# Patient Record
Sex: Male | Born: 1945 | Race: White | Hispanic: No | Marital: Married | Smoking: Former smoker
Health system: Southern US, Community
[De-identification: ages and names within clinical notes are randomized; demographics above are authoritative.]

## PROBLEM LIST (undated history)

## (undated) DIAGNOSIS — I10 Essential (primary) hypertension: Secondary | ICD-10-CM

## (undated) DIAGNOSIS — E119 Type 2 diabetes mellitus without complications: Secondary | ICD-10-CM

## (undated) DIAGNOSIS — F4310 Post-traumatic stress disorder, unspecified: Secondary | ICD-10-CM

## (undated) DIAGNOSIS — I639 Cerebral infarction, unspecified: Secondary | ICD-10-CM

## (undated) HISTORY — PX: TONSILLECTOMY: SUR1361

---

## 2015-02-17 ENCOUNTER — Encounter (HOSPITAL_COMMUNITY): Payer: Self-pay | Admitting: Family Medicine

## 2015-02-17 ENCOUNTER — Emergency Department (HOSPITAL_COMMUNITY): Payer: Medicare HMO

## 2015-02-17 ENCOUNTER — Emergency Department (HOSPITAL_COMMUNITY)
Admission: EM | Admit: 2015-02-17 | Discharge: 2015-02-18 | Disposition: A | Discharge status: Home / Self Care | Payer: Medicare HMO | Attending: Emergency Medicine | Admitting: Emergency Medicine

## 2015-02-17 DIAGNOSIS — E119 Type 2 diabetes mellitus without complications: Secondary | ICD-10-CM | POA: Insufficient documentation

## 2015-02-17 DIAGNOSIS — Z79899 Other long term (current) drug therapy: Secondary | ICD-10-CM | POA: Insufficient documentation

## 2015-02-17 DIAGNOSIS — M545 Low back pain, unspecified: Secondary | ICD-10-CM

## 2015-02-17 DIAGNOSIS — Z7982 Long term (current) use of aspirin: Secondary | ICD-10-CM | POA: Insufficient documentation

## 2015-02-17 DIAGNOSIS — Y9389 Activity, other specified: Secondary | ICD-10-CM | POA: Diagnosis not present

## 2015-02-17 DIAGNOSIS — S3992XA Unspecified injury of lower back, initial encounter: Secondary | ICD-10-CM | POA: Insufficient documentation

## 2015-02-17 DIAGNOSIS — S8012XA Contusion of left lower leg, initial encounter: Secondary | ICD-10-CM | POA: Diagnosis not present

## 2015-02-17 DIAGNOSIS — Y998 Other external cause status: Secondary | ICD-10-CM | POA: Insufficient documentation

## 2015-02-17 DIAGNOSIS — Z8673 Personal history of transient ischemic attack (TIA), and cerebral infarction without residual deficits: Secondary | ICD-10-CM | POA: Insufficient documentation

## 2015-02-17 DIAGNOSIS — Z794 Long term (current) use of insulin: Secondary | ICD-10-CM | POA: Insufficient documentation

## 2015-02-17 DIAGNOSIS — F4310 Post-traumatic stress disorder, unspecified: Secondary | ICD-10-CM | POA: Insufficient documentation

## 2015-02-17 DIAGNOSIS — S8992XA Unspecified injury of left lower leg, initial encounter: Secondary | ICD-10-CM | POA: Diagnosis present

## 2015-02-17 DIAGNOSIS — Y9289 Other specified places as the place of occurrence of the external cause: Secondary | ICD-10-CM | POA: Diagnosis not present

## 2015-02-17 DIAGNOSIS — W28XXXA Contact with powered lawn mower, initial encounter: Secondary | ICD-10-CM

## 2015-02-17 DIAGNOSIS — I10 Essential (primary) hypertension: Secondary | ICD-10-CM | POA: Insufficient documentation

## 2015-02-17 HISTORY — DX: Post-traumatic stress disorder, unspecified: F43.10

## 2015-02-17 HISTORY — DX: Essential (primary) hypertension: I10

## 2015-02-17 HISTORY — DX: Type 2 diabetes mellitus without complications: E11.9

## 2015-02-17 HISTORY — DX: Cerebral infarction, unspecified: I63.9

## 2015-02-17 LAB — CBG MONITORING, ED: GLUCOSE-CAPILLARY: 98 mg/dL (ref 65–99)

## 2015-02-17 LAB — BASIC METABOLIC PANEL
Anion gap: 9 (ref 5–15)
BUN: 10 mg/dL (ref 6–20)
CHLORIDE: 106 mmol/L (ref 101–111)
CO2: 23 mmol/L (ref 22–32)
Calcium: 9.3 mg/dL (ref 8.9–10.3)
Creatinine, Ser: 0.95 mg/dL (ref 0.61–1.24)
GFR calc non Af Amer: >60 mL/min (ref >60)
Glucose, Bld: 100 mg/dL — ABNORMAL HIGH (ref 65–99)
Potassium: 4.5 mmol/L (ref 3.5–5.1)
Sodium: 138 mmol/L (ref 135–145)

## 2015-02-17 LAB — CBC WITH DIFFERENTIAL/PLATELET
Basophils Absolute: 0.1 10*3/uL (ref 0.0–0.1)
Basophils Relative: 1 % (ref 0–1)
EOS ABS: 0.2 10*3/uL (ref 0.0–0.7)
Eosinophils Relative: 2 % (ref 0–5)
HCT: 41.9 % (ref 39.0–52.0)
Hemoglobin: 14.3 g/dL (ref 13.0–17.0)
Lymphocytes Relative: 19 % (ref 12–46)
Lymphs Abs: 2 10*3/uL (ref 0.7–4.0)
MCH: 29.2 pg (ref 26.0–34.0)
MCHC: 34.1 g/dL (ref 30.0–36.0)
MCV: 85.7 fL (ref 78.0–100.0)
MONOS PCT: 5 % (ref 3–12)
Monocytes Absolute: 0.5 10*3/uL (ref 0.1–1.0)
Neutro Abs: 7.4 10*3/uL (ref 1.7–7.7)
Neutrophils Relative %: 73 % (ref 43–77)
Platelets: 240 10*3/uL (ref 150–400)
RBC: 4.89 MIL/uL (ref 4.22–5.81)
RDW: 13.6 % (ref 11.5–15.5)
WBC: 10.1 10*3/uL (ref 4.0–10.5)

## 2015-02-17 LAB — PROTIME-INR
INR: 0.99 (ref 0.00–1.49)
Prothrombin Time: 13.2 seconds (ref 11.6–15.2)

## 2015-02-17 LAB — I-STAT CG4 LACTIC ACID, ED
LACTIC ACID, VENOUS: 1.6 mmol/L (ref 0.5–2.0)
Lactic Acid, Venous: 2.36 mmol/L (ref 0.5–2.0)

## 2015-02-17 LAB — LIPASE, BLOOD: LIPASE: 17 U/L — AB (ref 22–51)

## 2015-02-17 MED ORDER — SODIUM CHLORIDE 0.9 % IV BOLUS (SEPSIS)
1000.0000 mL | Freq: Once | INTRAVENOUS | Status: AC
  Administered 2015-02-17: 1000 mL via INTRAVENOUS

## 2015-02-17 MED ORDER — MORPHINE SULFATE 4 MG/ML IJ SOLN: 4.0000 mg | Freq: Once | INTRAMUSCULAR | Status: DC
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
## 2015-02-17 NOTE — Progress Notes (Signed)
Pt on room air at this time. Rt will continue to monitor.

## 2015-02-17 NOTE — ED Notes (Signed)
Dr Rhunette CroftNanavati given a copy of lactic acid results 2.36
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
## 2015-02-18 MED ORDER — HYDROCODONE-ACETAMINOPHEN 5-325 MG PO TABS: 2.0000 | ORAL_TABLET | Freq: Four times a day (QID) | ORAL | Status: DC | PRN

## 2015-02-18 NOTE — ED Notes (Signed)
Pt. Left with all belongings 

## 2015-02-18 NOTE — Discharge Instructions (Signed)
Back Pain, Adult Low back pain is very common. About 1 in 5 people have back pain.The cause of low back pain is rarely dangerous. The pain often gets better over time.About half of people with a sudden onset of back pain feel better in just 2 weeks. About 8 in 10 people feel better by 6 weeks.  CAUSES Some common causes of back pain include:  Strain of the muscles or ligaments supporting the spine.  Wear and tear (degeneration) of the spinal discs.  Arthritis.  Direct injury to the back. DIAGNOSIS Most of the time, the direct cause of low back pain is not known.However, back pain can be treated effectively even when the exact cause of the pain is unknown.Answering your caregiver's questions about your overall health and symptoms is one of the most accurate ways to make sure the cause of your pain is not dangerous. If your caregiver needs more information, he or she may order lab work or imaging tests (X-rays or MRIs).However, even if imaging tests show changes in your back, this usually does not require surgery. HOME CARE INSTRUCTIONS For many people, back pain returns.Since low back pain is rarely dangerous, it is often a condition that people can learn to manageon their own.   Remain active. It is stressful on the back to sit or stand in one place. Do not sit, drive, or stand in one place for more than 30 minutes at a time. Take short walks on level surfaces as soon as pain allows.Try to increase the length of time you walk each day.  Do not stay in bed.Resting more than 1 or 2 days can delay your recovery.  Do not avoid exercise or work.Your body is made to move.It is not dangerous to be active, even though your back may hurt.Your back will likely heal faster if you return to being active before your pain is gone.  Pay attention to your body when you bend and lift. Many people have less discomfortwhen lifting if they bend their knees, keep the load close to their bodies,and  avoid twisting. Often, the most comfortable positions are those that put less stress on your recovering back.  Find a comfortable position to sleep. Use a firm mattress and lie on your side with your knees slightly bent. If you lie on your back, put a pillow under your knees.  Only take over-the-counter or prescription medicines as directed by your caregiver. Over-the-counter medicines to reduce pain and inflammation are often the most helpful.Your caregiver may prescribe muscle relaxant drugs.These medicines help dull your pain so you can more quickly return to your normal activities and healthy exercise.  Put ice on the injured area.  Put ice in a plastic bag.  Place a towel between your skin and the bag.  Leave the ice on for 15-20 minutes, 03-04 times a day for the first 2 to 3 days. After that, ice and heat may be alternated to reduce pain and spasms.  Ask your caregiver about trying back exercises and gentle massage. This may be of some benefit.  Avoid feeling anxious or stressed.Stress increases muscle tension and can worsen back pain.It is important to recognize when you are anxious or stressed and learn ways to manage it.Exercise is a great option. SEEK MEDICAL CARE IF:  You have pain that is not relieved with rest or medicine.  You have pain that does not improve in 1 week.  You have new symptoms.  You are generally not feeling well. SEEK   IMMEDIATE MEDICAL CARE IF:   You have pain that radiates from your back into your legs.  You develop new bowel or bladder control problems.  You have unusual weakness or numbness in your arms or legs.  You develop nausea or vomiting.  You develop abdominal pain.  You feel faint. Document Released: 09/22/2005 Document Revised: 03/23/2012 Document Reviewed: 01/24/2014 [employer] Patient Information 2015 [employer]. This information is not intended to replace advice given to you by your health care provider. Make sure you  discuss any questions you have with your health care provider.  

## 2018-10-30 ENCOUNTER — Emergency Department (HOSPITAL_COMMUNITY): Payer: No Typology Code available for payment source

## 2018-10-30 ENCOUNTER — Inpatient Hospital Stay (HOSPITAL_COMMUNITY)
Admission: EM | Admit: 2018-10-30 | Discharge: 2018-11-01 | DRG: 202 | Disposition: A | Discharge status: Home / Self Care | Payer: No Typology Code available for payment source | Attending: Internal Medicine | Admitting: Internal Medicine

## 2018-10-30 ENCOUNTER — Other Ambulatory Visit: Payer: Self-pay

## 2018-10-30 ENCOUNTER — Encounter (HOSPITAL_COMMUNITY): Payer: Self-pay | Admitting: *Deleted

## 2018-10-30 DIAGNOSIS — Z888 Allergy status to other drugs, medicaments and biological substances status: Secondary | ICD-10-CM | POA: Diagnosis not present

## 2018-10-30 DIAGNOSIS — J9601 Acute respiratory failure with hypoxia: Secondary | ICD-10-CM | POA: Diagnosis present

## 2018-10-30 DIAGNOSIS — Z8673 Personal history of transient ischemic attack (TIA), and cerebral infarction without residual deficits: Secondary | ICD-10-CM | POA: Diagnosis not present

## 2018-10-30 DIAGNOSIS — J209 Acute bronchitis, unspecified: Principal | ICD-10-CM | POA: Diagnosis present

## 2018-10-30 DIAGNOSIS — R0602 Shortness of breath: Secondary | ICD-10-CM

## 2018-10-30 DIAGNOSIS — I10 Essential (primary) hypertension: Secondary | ICD-10-CM | POA: Diagnosis present

## 2018-10-30 DIAGNOSIS — Z7982 Long term (current) use of aspirin: Secondary | ICD-10-CM | POA: Diagnosis not present

## 2018-10-30 DIAGNOSIS — F4310 Post-traumatic stress disorder, unspecified: Secondary | ICD-10-CM | POA: Diagnosis present

## 2018-10-30 DIAGNOSIS — E119 Type 2 diabetes mellitus without complications: Secondary | ICD-10-CM | POA: Diagnosis present

## 2018-10-30 DIAGNOSIS — Z79899 Other long term (current) drug therapy: Secondary | ICD-10-CM

## 2018-10-30 DIAGNOSIS — G4733 Obstructive sleep apnea (adult) (pediatric): Secondary | ICD-10-CM | POA: Diagnosis present

## 2018-10-30 DIAGNOSIS — Z794 Long term (current) use of insulin: Secondary | ICD-10-CM

## 2018-10-30 DIAGNOSIS — J9600 Acute respiratory failure, unspecified whether with hypoxia or hypercapnia: Secondary | ICD-10-CM | POA: Diagnosis present

## 2018-10-30 LAB — BASIC METABOLIC PANEL
Anion gap: 15 (ref 5–15)
BUN: 12 mg/dL (ref 8–23)
CO2: 22 mmol/L (ref 22–32)
Calcium: 8.9 mg/dL (ref 8.9–10.3)
Chloride: 103 mmol/L (ref 98–111)
Creatinine, Ser: 1.08 mg/dL (ref 0.61–1.24)
GFR calc Af Amer: >60 mL/min (ref >60)
GFR calc non Af Amer: >60 mL/min (ref >60)
Glucose, Bld: 261 mg/dL — ABNORMAL HIGH (ref 70–99)
Potassium: 3.9 mmol/L (ref 3.5–5.1)
SODIUM: 140 mmol/L (ref 135–145)

## 2018-10-30 LAB — POCT I-STAT 7, (LYTES, BLD GAS, ICA,H+H)
Acid-base deficit: 5 mmol/L — ABNORMAL HIGH (ref 0.0–2.0)
Bicarbonate: 18.3 mmol/L — ABNORMAL LOW (ref 20.0–28.0)
Calcium, Ion: 1.16 mmol/L (ref 1.15–1.40)
HCT: 35 % — ABNORMAL LOW (ref 39.0–52.0)
Hemoglobin: 11.9 g/dL — ABNORMAL LOW (ref 13.0–17.0)
O2 SAT: 96 %
Potassium: 2.9 mmol/L — ABNORMAL LOW (ref 3.5–5.1)
Sodium: 135 mmol/L (ref 135–145)
TCO2: 19 mmol/L — ABNORMAL LOW (ref 22–32)
pCO2 arterial: 27.3 mmHg — ABNORMAL LOW (ref 32.0–48.0)
pH, Arterial: 7.434 (ref 7.350–7.450)
pO2, Arterial: 80 mmHg — ABNORMAL LOW (ref 83.0–108.0)

## 2018-10-30 LAB — CBC WITH DIFFERENTIAL/PLATELET
Abs Immature Granulocytes: 0.26 10*3/uL — ABNORMAL HIGH (ref 0.00–0.07)
BASOS ABS: 0.1 10*3/uL (ref 0.0–0.1)
Basophils Relative: 1 %
EOS PCT: 1 %
Eosinophils Absolute: 0.2 10*3/uL (ref 0.0–0.5)
HCT: 43 % (ref 39.0–52.0)
Hemoglobin: 14.2 g/dL (ref 13.0–17.0)
Immature Granulocytes: 2 %
Lymphocytes Relative: 10 %
Lymphs Abs: 1.5 10*3/uL (ref 0.7–4.0)
MCH: 28.7 pg (ref 26.0–34.0)
MCHC: 33 g/dL (ref 30.0–36.0)
MCV: 86.9 fL (ref 80.0–100.0)
Monocytes Absolute: 0.4 10*3/uL (ref 0.1–1.0)
Monocytes Relative: 3 %
NRBC: 0 % (ref 0.0–0.2)
Neutro Abs: 13.3 10*3/uL — ABNORMAL HIGH (ref 1.7–7.7)
Neutrophils Relative %: 83 %
Platelets: 265 10*3/uL (ref 150–400)
RBC: 4.95 MIL/uL (ref 4.22–5.81)
RDW: 13.5 % (ref 11.5–15.5)
WBC: 15.8 10*3/uL — ABNORMAL HIGH (ref 4.0–10.5)

## 2018-10-30 LAB — BRAIN NATRIURETIC PEPTIDE: B NATRIURETIC PEPTIDE 5: 47.5 pg/mL (ref 0.0–100.0)

## 2018-10-30 LAB — CBG MONITORING, ED: GLUCOSE-CAPILLARY: 344 mg/dL — AB (ref 70–99)

## 2018-10-30 MED ORDER — IPRATROPIUM BROMIDE 0.02 % IN SOLN
0.5000 mg | Freq: Four times a day (QID) | RESPIRATORY_TRACT | Status: DC
  Administered 2018-10-31 (×2): 0.5 mg via RESPIRATORY_TRACT
  Filled 2018-10-30 (×2): qty 2.5

## 2018-10-30 MED ORDER — SODIUM CHLORIDE 0.9 % IV SOLN: 250.0000 mL | INTRAVENOUS | Status: DC | PRN

## 2018-10-30 MED ORDER — ONDANSETRON HCL 4 MG/2ML IJ SOLN: 4.0000 mg | Freq: Four times a day (QID) | INTRAMUSCULAR | Status: DC | PRN

## 2018-10-30 MED ORDER — VITAMIN D 25 MCG (1000 UNIT) PO TABS
1000.0000 [IU] | ORAL_TABLET | Freq: Every day | ORAL | Status: DC
  Administered 2018-10-31 – 2018-11-01 (×2): 1000 [IU] via ORAL
  Filled 2018-10-30 (×2): qty 1

## 2018-10-30 MED ORDER — GUAIFENESIN ER 600 MG PO TB12
600.0000 mg | ORAL_TABLET | Freq: Two times a day (BID) | ORAL | Status: DC
  Administered 2018-10-31 – 2018-11-01 (×3): 600 mg via ORAL
  Filled 2018-10-30 (×3): qty 1

## 2018-10-30 MED ORDER — ALBUTEROL SULFATE (2.5 MG/3ML) 0.083% IN NEBU
2.5000 mg | INHALATION_SOLUTION | RESPIRATORY_TRACT | Status: DC | PRN
  Administered 2018-11-01: 2.5 mg via RESPIRATORY_TRACT
  Filled 2018-10-30: qty 3

## 2018-10-30 MED ORDER — FLUOXETINE HCL 20 MG PO CAPS: 60.0000 mg | ORAL_CAPSULE | Freq: Every day | ORAL | Status: DC

## 2018-10-30 MED ORDER — AZITHROMYCIN 500 MG PO TABS
500.00 | ORAL_TABLET | ORAL | Status: DC
Start: 2018-10-31 — End: 2018-10-30

## 2018-10-30 MED ORDER — PREGABALIN 100 MG PO CAPS
300.0000 mg | ORAL_CAPSULE | Freq: Two times a day (BID) | ORAL | Status: DC
  Administered 2018-10-31 – 2018-11-01 (×2): 300 mg via ORAL
  Filled 2018-10-30 (×2): qty 3

## 2018-10-30 MED ORDER — ALBUTEROL SULFATE (2.5 MG/3ML) 0.083% IN NEBU
2.5000 mg | INHALATION_SOLUTION | Freq: Four times a day (QID) | RESPIRATORY_TRACT | Status: DC
  Administered 2018-10-31 (×2): 2.5 mg via RESPIRATORY_TRACT
  Filled 2018-10-30 (×2): qty 3

## 2018-10-30 MED ORDER — SODIUM CHLORIDE 0.9% FLUSH
3.0000 mL | Freq: Two times a day (BID) | INTRAVENOUS | Status: DC
  Administered 2018-10-31 – 2018-11-01 (×2): 3 mL via INTRAVENOUS

## 2018-10-30 MED ORDER — LORAZEPAM 2 MG/ML IJ SOLN: 0.5000 mg | Freq: Once | INTRAMUSCULAR | Status: DC

## 2018-10-30 MED ORDER — PRAZOSIN HCL 2 MG PO CAPS
15.0000 mg | ORAL_CAPSULE | Freq: Every day | ORAL | Status: DC
  Administered 2018-10-31: 15 mg via ORAL
  Filled 2018-10-30: qty 1

## 2018-10-30 MED ORDER — MAGNESIUM SULFATE 2 GM/50ML IV SOLN
2.0000 g | Freq: Once | INTRAVENOUS | Status: AC
  Administered 2018-10-30: 2 g via INTRAVENOUS
  Filled 2018-10-30: qty 50

## 2018-10-30 MED ORDER — MODAFINIL 100 MG PO TABS
300.0000 mg | ORAL_TABLET | Freq: Every day | ORAL | Status: DC
  Administered 2018-11-01: 300 mg via ORAL
  Filled 2018-10-30 (×2): qty 3

## 2018-10-30 MED ORDER — ATENOLOL 50 MG PO TABS
50.0000 mg | ORAL_TABLET | Freq: Two times a day (BID) | ORAL | Status: DC
  Administered 2018-10-30: 50 mg via ORAL
  Filled 2018-10-30: qty 1

## 2018-10-30 MED ORDER — SODIUM CHLORIDE 0.9% FLUSH: 3.0000 mL | INTRAVENOUS | Status: DC | PRN

## 2018-10-30 MED ORDER — PRAZOSIN HCL 2 MG PO CAPS
5.0000 mg | ORAL_CAPSULE | Freq: Once | ORAL | Status: DC
  Filled 2018-10-30 (×2): qty 1

## 2018-10-30 MED ORDER — ASPIRIN EC 325 MG PO TBEC
325.0000 mg | DELAYED_RELEASE_TABLET | Freq: Every day | ORAL | Status: DC
  Administered 2018-10-31 – 2018-11-01 (×2): 325 mg via ORAL
  Filled 2018-10-30 (×2): qty 1

## 2018-10-30 MED ORDER — METHYLPREDNISOLONE SODIUM SUCC 125 MG IJ SOLR
80.0000 mg | Freq: Two times a day (BID) | INTRAMUSCULAR | Status: DC
  Administered 2018-10-30: 80 mg via INTRAVENOUS
  Filled 2018-10-30: qty 2

## 2018-10-30 MED ORDER — INSULIN ASPART 100 UNIT/ML ~~LOC~~ SOLN: 0.0000 [IU] | Freq: Three times a day (TID) | SUBCUTANEOUS | Status: DC

## 2018-10-30 MED ORDER — ONDANSETRON HCL 4 MG PO TABS: 4.0000 mg | ORAL_TABLET | Freq: Four times a day (QID) | ORAL | Status: DC | PRN

## 2018-10-30 MED ORDER — LORAZEPAM 2 MG/ML IJ SOLN
0.5000 mg | Freq: Once | INTRAMUSCULAR | Status: AC
  Administered 2018-10-30: 0.5 mg via INTRAVENOUS

## 2018-10-30 MED ORDER — ALBUTEROL (5 MG/ML) CONTINUOUS INHALATION SOLN
10.0000 mg/h | INHALATION_SOLUTION | Freq: Once | RESPIRATORY_TRACT | Status: AC
  Administered 2018-10-30: 10 mg/h via RESPIRATORY_TRACT

## 2018-10-30 MED ORDER — BUPROPION HCL ER (XL) 150 MG PO TB24
300.0000 mg | ORAL_TABLET | Freq: Every day | ORAL | Status: DC
  Administered 2018-10-31 – 2018-11-01 (×2): 300 mg via ORAL
  Filled 2018-10-30 (×2): qty 2

## 2018-10-30 MED ORDER — LORAZEPAM 2 MG/ML IJ SOLN
INTRAMUSCULAR | Status: AC
  Filled 2018-10-30: qty 1
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
## 2018-10-30 NOTE — H&P (Signed)
History and Physical    Vincent Richmond VLD:[id number] DOB: September 01, 1946 DOA: 10/30/2018  PCP: [facility]  Patient coming from: Home  Chief Complaint: Shortness of breath and wheezing  HPI: Vincent Richmond is a 73 y.o. male with medical history significant of PTSD, hypertension, diabetes comes in with a viral illness that is been going on for 2weeks diagnosed the end and was treated with azithromycin.  Went to the emergency room in [place] again today I gave him Solu-Medrol and some breathing treatments and sent him home on prednisone.  He was unable to make at home he started wheezing again get very short of breath while he was getting his prescriptions filled.  His wife called 911 who found him diffusely wheezing with oxygen sats of 84% on room air at the pharmacy store.  Patient denies any fevers.  He is been coughing.  He is not a smoker.  He has no history of asthma.  He denies any chest pain lower extremity edema or swelling.  His wife is also sick with a URI.  Patient is being referred for admission for acute hypoxic respiratory failure currently on BiPAP and is feeling much better.  He is received several bronchodilators in the ED and is starting to be able to breathe a little bit more freely.  Review of Systems: As per HPI otherwise 10 point review of systems negative.   Past Medical History:  Diagnosis Date  . Diabetes mellitus without complication (HCC)   . Hypertension   . PTSD (post-traumatic stress disorder)   . Stroke [facility])     History reviewed. No pertinent surgical history.   reports that he has never smoked. He does not have any smokeless tobacco history on file. He reports that he does not drink alcohol. No history on file for drug.  Allergies  Allergen Reactions  . Statins Other (See Comments)    Severe muscle pains    No family history on file.  Premature coronary artery disease  Prior to Admission medications   Medication Sig Start Date End Date  Taking? Authorizing Provider  Ascorbic Acid (VITAMIN C PO) Take 100 mg by mouth daily.   Yes [provider]  aspirin EC 325 MG tablet Take 325 mg by mouth daily.   Yes [provider]  atenolol (TENORMIN) 50 MG tablet Take 50 mg by mouth 2 (two) times daily.   Yes [provider]  buPROPion (WELLBUTRIN XL) 300 MG 24 hr tablet Take 300 mg by mouth daily.   Yes [provider]  cholecalciferol (VITAMIN D) 1000 UNITS tablet Take 1,000 Units by mouth daily.   Yes [provider]  FLUoxetine (PROZAC) 20 MG capsule Take 60 mg by mouth daily. For depression and anxiety    Yes [provider]  glipiZIDE (GLUCOTROL) 5 MG tablet Take 10 mg by mouth 2 (two) times daily.    Yes [provider]  insulin glargine (LANTUS) 100 unit/mL SOPN Inject 75 Units into the skin daily.    Yes [provider]  loperamide (IMODIUM) 2 MG capsule Take 2 mg by mouth daily.   Yes [provider]  metFORMIN (GLUCOPHAGE) 1000 MG tablet Take 1,000 mg by mouth 2 (two) times daily with a meal.   Yes [provider]  modafinil (PROVIGIL) 100 MG tablet Take 300 mg by mouth daily.   Yes [provider]  NON FORMULARY CPAP Machine daily   Yes [provider]  Omega-3 Fatty Acids (FISH OIL) 1000  MG CAPS Take 1,000 mg by mouth daily.    Yes [provider]  omeprazole (PRILOSEC) 20 MG capsule Take 20 mg by mouth daily.   Yes [provider]  prazosin (MINIPRESS) 5 MG capsule Take 15 mg by mouth at bedtime. For nightmares and flashbacks    Yes [provider]  pregabalin (LYRICA) 300 MG capsule Take 300 mg by mouth 2 (two) times daily.   Yes [provider]  HYDROcodone-acetaminophen (NORCO/VICODIN) 5-325 MG per tablet Take 2 tablets by mouth every 6 (six) hours as needed. Patient not taking: Reported on 10/30/2018 02/18/15   Tegeler, Canary Brim, MD    Physical Exam: Vitals:   10/30/18 1918  10/30/18 1930 10/30/18 2000 10/30/18 2006  BP:  (!) 165/87 (!) 140/104   Pulse:  (!) 115 (!) 116 (!) 108  Resp:  20  (!) 22  Temp: 98.8 F (37.1 C)     TempSrc: Oral     SpO2:  97% 90% 97%  Weight:      Height:          Constitutional: NAD, calm, comfortable on BiPAP mentating normally Vitals:   10/30/18 1918 10/30/18 1930 10/30/18 2000 10/30/18 2006  BP:  (!) 165/87 (!) 140/104   Pulse:  (!) 115 (!) 116 (!) 108  Resp:  20  (!) 22  Temp: 98.8 F (37.1 C)     TempSrc: Oral     SpO2:  97% 90% 97%  Weight:      Height:       Eyes: PERRL, lids and conjunctivae normal ENMT: Mucous membranes are moist. Posterior pharynx clear of any exudate or lesions.Normal dentition.  Neck: normal, supple, no masses, no thyromegaly Respiratory: clear to auscultation bilaterally, expiratory wheezing with decreased air movement, no crackles. Normal respiratory effort. No accessory muscle use.  Cardiovascular: Regular rate and rhythm, no murmurs / rubs / gallops. No extremity edema. 2+ pedal pulses. No carotid bruits.  Abdomen: no tenderness, no masses palpated. No hepatosplenomegaly. Bowel sounds positive.  Musculoskeletal: no clubbing / cyanosis. No joint deformity upper and lower extremities. Good ROM, no contractures. Normal muscle tone.  Skin: no rashes, lesions, ulcers. No induration Neurologic: CN 2-12 grossly intact. Sensation intact, DTR normal. Strength 5/5 in all 4.  Psychiatric: Normal judgment and insight. Alert and oriented x 3. Normal mood.    Labs on Admission: I have personally reviewed following labs and imaging studies  CBC: Recent Labs  Lab 10/30/18 1844 10/30/18 1956  WBC 15.8*  --   NEUTROABS 13.3*  --   HGB 14.2 11.9*  HCT 43.0 35.0*  MCV 86.9  --   PLT 265  --    Basic Metabolic Panel: Recent Labs  Lab 10/30/18 1844 10/30/18 1956  NA 140 135  K 3.9 2.9*  CL 103  --   CO2 22  --   GLUCOSE 261*  --   BUN 12  --   CREATININE 1.08  --   CALCIUM 8.9  --     GFR: Estimated Creatinine Clearance: 81.2 mL/min (by C-G formula based on SCr of 1.08 mg/dL). Liver Function Tests: No results for input(s): AST, ALT, ALKPHOS, BILITOT, PROT, ALBUMIN in the last 168 hours. No results for input(s): LIPASE, AMYLASE in the last 168 hours. No results for input(s): AMMONIA in the last 168 hours. Coagulation Profile: No results for input(s): INR, PROTIME in the last 168 hours. Cardiac Enzymes: No results for input(s): CKTOTAL, CKMB, CKMBINDEX, TROPONINI in the last 168  hours. BNP (last 3 results) No results for input(s): PROBNP in the last 8760 hours. HbA1C: No results for input(s): HGBA1C in the last 72 hours. CBG: No results for input(s): GLUCAP in the last 168 hours. Lipid Profile: No results for input(s): CHOL, HDL, LDLCALC, TRIG, CHOLHDL, LDLDIRECT in the last 72 hours. Thyroid Function Tests: No results for input(s): TSH, T4TOTAL, FREET4, T3FREE, THYROIDAB in the last 72 hours. Anemia Panel: No results for input(s): VITAMINB12, FOLATE, FERRITIN, TIBC, IRON, RETICCTPCT in the last 72 hours. Urine analysis: No results found for: COLORURINE, APPEARANCEUR, LABSPEC, PHURINE, GLUCOSEU, HGBUR, BILIRUBINUR, KETONESUR, PROTEINUR, UROBILINOGEN, NITRITE, LEUKOCYTESUR Sepsis Labs: !!!!!!!!!!!!!!!!!!!!!!!!!!!!!!!!!!!!!!!!!!!! @LABRCNTIP (procalcitonin:4,lacticidven:4) )No results found for this or any previous visit (from the past 240 hour(s)).   Radiological Exams on Admission: Dg Chest Portable 1 View  Result Date: 10/30/2018 CLINICAL DATA:  Cough and increasing shortness of Breath EXAM: PORTABLE CHEST 1 VIEW COMPARISON:  02/17/2015 FINDINGS: Cardiac shadow is at the upper limits of normal in size. The lungs are well aerated bilaterally. No focal infiltrate or sizable effusion is seen. Degenerative changes of the thoracic spine are again seen. No other focal abnormality is noted. IMPRESSION: No acute abnormality seen. Electronically Signed   By: Alcide CleverMark  Lukens  M.D.   On: 10/30/2018 19:31   Chest x-ray reviewed no edema or infiltrate Old chart reviewed Case cussed with EDP  Assessment/Plan 73 year old male with acute respiratory hypoxic failure secondary to bronchitis with bronchospasm Principal Problem:   Acute respiratory failure with hypoxia (HCC)-continue BiPAP support and wean as tolerates.  Treat with Solu-Medrol frequent nebulizers and Mucinex as above below.  Active Problems:   Bronchitis with bronchospasm-Solu-Medrol 80 mg IV every 12 hours.  Frequent nebulizer treatments and Mucinex.  Supportive care.  Wean BiPAP as tolerates.  Chest x-ray negative for pneumonia.    PTSD (post-traumatic stress disorder)-noted    Diabetes mellitus without complication (HCC)-hold insulin and oral agents and placed on sliding scale insulin while he is here he is currently n.p.o. on BiPAP.    Hypertension-resume home meds     DVT prophylaxis: SCDs Code Status: Full Family Communication: Wife daughter and son Disposition Plan: 24 to 48 hours Consults called: None Admission status: Admission   , A MD [facility]  If 7PM-7AM, please contact night-coverage [email or web address] Password [email or web address]  10/30/2018, 8:41 PM

## 2018-10-30 NOTE — ED Provider Notes (Signed)
[facility] EMERGENCY DEPARTMENT Provider Note   CSN: [id number] Arrival date & time: 10/30/18  1825     History   Chief Complaint Chief Complaint  Patient presents with  . Shortness of Breath    HPI Vincent Richmond is a 73 y.o. male with PMH/o DM, HTN, PTSD by EMS for evaluation of shortness of breath.  Per wife, patient has had URI symptoms for the last 2-1/2 weeks.  She states that he has had congestion, cough has been persistent.  She reports that he was seen at urgent care and was prescribed some medication help with cough and was diagnosed with bronchitis.  He states that he continued to have worsening cough.  She reports that over the last 3 days, his cough, difficulty breathing has become significantly worse.  She reports that he was unable to sleep laying flat because of the cough.  She reports he had to sleep sitting up which is abnormal for him.  Patient went to [facility] ED today because of symptoms.  At that time, chest x-ray was unremarkable.  He did have some wheezing that was noted.  He was given steroids, breathing treatments x2 and had some improvement and was discharged home with antibiotics, prednisone.  Wife reports that when they got in the car, he continuously cough all the way home.  She reports she went out to fill the prescriptions and when she came back home, patient was having difficulty breathing so she called EMS.  On EMS arrival, patient with obvious wheezing noted throughout all lung fields.  He was 84% on room air.  EMS gave albuterol, Solu-Medrol which bumped up his O2 sats to 95% on room air.  Patient reports he feels slightly better after breathing treatment but still having significant work of breathing.  Patient reports no history of COPD but was told today at the ED earlier that he might be starting to have COPD.  Denies any smoking history.  Patient states he does not have any chest pain.  Patient denies any abdominal pain,  nausea/vomiting.  The history is provided by the patient and the spouse.    Past Medical History:  Diagnosis Date  . Diabetes mellitus without complication (HCC)   . Hypertension   . PTSD (post-traumatic stress disorder)   . Stroke [facility])     Patient Active Problem List   Diagnosis Date Noted  . Acute respiratory failure with hypoxia (HCC) 10/30/2018  . Bronchitis with bronchospasm 10/30/2018  . Acute respiratory failure (HCC) 10/30/2018  . PTSD (post-traumatic stress disorder)   . Diabetes mellitus without complication (HCC)   . Hypertension     History reviewed. No pertinent surgical history.      Home Medications    Prior to Admission medications   Medication Sig Start Date End Date Taking? Authorizing Provider  Ascorbic Acid (VITAMIN C PO) Take 100 mg by mouth daily.   Yes [provider]  aspirin EC 325 MG tablet Take 325 mg by mouth daily.   Yes [provider]  atenolol (TENORMIN) 50 MG tablet Take 50 mg by mouth 2 (two) times daily.   Yes [provider]  buPROPion (WELLBUTRIN XL) 300 MG 24 hr tablet Take 300 mg by mouth daily.   Yes [provider]  cholecalciferol (VITAMIN D) 1000 UNITS tablet Take 1,000 Units by mouth daily.   Yes [provider]  FLUoxetine (PROZAC) 20 MG capsule Take 60 mg by mouth daily. For depression and anxiety  Yes [provider]  glipiZIDE (GLUCOTROL) 5 MG tablet Take 10 mg by mouth 2 (two) times daily.    Yes [provider]  insulin glargine (LANTUS) 100 unit/mL SOPN Inject 75 Units into the skin daily.    Yes [provider]  loperamide (IMODIUM) 2 MG capsule Take 2 mg by mouth daily.   Yes [provider]  metFORMIN (GLUCOPHAGE) 1000 MG tablet Take 1,000 mg by mouth 2 (two) times daily with a meal.   Yes [provider]  modafinil (PROVIGIL) 100 MG tablet Take 300 mg by mouth daily.   Yes [provider]  NON FORMULARY CPAP Machine  daily   Yes [provider]  Omega-3 Fatty Acids (FISH OIL) 1000 MG CAPS Take 1,000 mg by mouth daily.    Yes [provider]  omeprazole (PRILOSEC) 20 MG capsule Take 20 mg by mouth daily.   Yes [provider]  prazosin (MINIPRESS) 5 MG capsule Take 15 mg by mouth at bedtime. For nightmares and flashbacks    Yes [provider]  pregabalin (LYRICA) 300 MG capsule Take 300 mg by mouth 2 (two) times daily.   Yes [provider]  HYDROcodone-acetaminophen (NORCO/VICODIN) 5-325 MG per tablet Take 2 tablets by mouth every 6 (six) hours as needed. Patient not taking: Reported on 10/30/2018 02/18/15   Tegeler, Canary Brim, MD    Family History No family history on file.  Social History Social History   Tobacco Use  . Smoking status: Never Smoker  Substance Use Topics  . Alcohol use: No  . Drug use: Not on file     Allergies   Statins   Review of Systems Review of Systems  Constitutional: Negative for fever.  HENT: Positive for congestion.   Respiratory: Positive for cough, shortness of breath and wheezing.   Cardiovascular: Negative for chest pain.  Gastrointestinal: Negative for abdominal pain, nausea and vomiting.  Genitourinary: Negative for dysuria and hematuria.  Neurological: Negative for headaches.  All other systems reviewed and are negative.    Physical Exam Updated Vital Signs BP (!) 140/104   Pulse (!) 108   Temp 98.8 F (37.1 C) (Oral)   Resp (!) 22   Ht 5\' 11"  (1.803 m)   Wt 122.5 kg   SpO2 97%   BMI 37.66 kg/m   Physical Exam Vitals signs and nursing note reviewed.  Constitutional:      Appearance: Normal appearance. He is well-developed. He is ill-appearing.  HENT:     Head: Normocephalic and atraumatic.  Eyes:     General: Lids are normal.     Conjunctiva/sclera: Conjunctivae normal.     Pupils: Pupils are equal, round, and reactive to light.  Neck:     Musculoskeletal: Full passive range of motion  without pain.  Cardiovascular:     Rate and Rhythm: Regular rhythm. Tachycardia present.     Pulses: Normal pulses.     Heart sounds: Normal heart sounds. No murmur. No friction rub. No gallop.   Pulmonary:     Effort: Tachypnea present.     Breath sounds: Decreased breath sounds and wheezing present.     Comments: Audible wheezing present. Wheezing noted throughout all lung fields. Speaking in very short sentences.  Abdominal:     Palpations: Abdomen is soft. Abdomen is not rigid.     Tenderness: There is no abdominal tenderness. There is no guarding.  Musculoskeletal: Normal range of motion.     Comments: 1+ pitting edema  noted bilateral lower extremities at the distal tib-fib.  No overlying warmth, erythema, edema.  Skin:    General: Skin is warm and dry.     Capillary Refill: Capillary refill takes less than 2 seconds.  Neurological:     Mental Status: He is alert and oriented to person, place, and time.  Psychiatric:        Speech: Speech normal.     ED Treatments / Results  Labs (all labs ordered are listed, but only abnormal results are displayed) Labs Reviewed  CBC WITH DIFFERENTIAL/PLATELET - Abnormal; Notable for the following components:      Result Value   WBC 15.8 (*)    Neutro Abs 13.3 (*)    Abs Immature Granulocytes 0.26 (*)    All other components within normal limits  BASIC METABOLIC PANEL - Abnormal; Notable for the following components:   Glucose, Bld 261 (*)    All other components within normal limits  POCT I-STAT 7, (LYTES, BLD GAS, ICA,H+H) - Abnormal; Notable for the following components:   pCO2 arterial 27.3 (*)    pO2, Arterial 80.0 (*)    Bicarbonate 18.3 (*)    TCO2 19 (*)    Acid-base deficit 5.0 (*)    Potassium 2.9 (*)    HCT 35.0 (*)    Hemoglobin 11.9 (*)    All other components within normal limits  BRAIN NATRIURETIC PEPTIDE  BASIC METABOLIC PANEL  CBC    EKG EKG Interpretation  Date/Time:  Saturday October 30 2018 18:33:04  EST Ventricular Rate:  102 PR Interval:    QRS Duration: 92 QT Interval:  365 QTC Calculation: 476 R Axis:   -53 Text Interpretation:  Sinus tachycardia Borderline prolonged PR interval Left anterior fascicular block Borderline T abnormalities, anterior leads Borderline prolonged QT interval Baseline wander in lead(s) V3 V4 V6 No prior ECG for comparison.  No STEMI Confirmed by Theda Belfastegeler, Chris ([id number]) on 10/30/2018 7:47:01 PM   Radiology Dg Chest Portable 1 View  Result Date: 10/30/2018 CLINICAL DATA:  Cough and increasing shortness of Breath EXAM: PORTABLE CHEST 1 VIEW COMPARISON:  02/17/2015 FINDINGS: Cardiac shadow is at the upper limits of normal in size. The lungs are well aerated bilaterally. No focal infiltrate or sizable effusion is seen. Degenerative changes of the thoracic spine are again seen. No other focal abnormality is noted. IMPRESSION: No acute abnormality seen. Electronically Signed   By: Alcide CleverMark  Lukens M.D.   On: 10/30/2018 19:31    Procedures .Critical Care Performed by: Maxwell CaulLayden, Caeson Filippi A, PA-C Authorized by: Maxwell CaulLayden, Eban Weick A, PA-C   Critical care provider statement:    Critical care time (minutes):  45   Critical care was necessary to treat or prevent imminent or life-threatening deterioration of the following conditions:  Respiratory failure   Critical care was time spent personally by me on the following activities:  Discussions with consultants, evaluation of patient's response to treatment, examination of patient, ordering and performing treatments and interventions, ordering and review of laboratory studies, ordering and review of radiographic studies, pulse oximetry, re-evaluation of patient's condition, obtaining history from patient or surrogate and review of old charts   (including critical care time)  Medications Ordered in ED Medications  aspirin EC tablet 325 mg (has no administration in time range)  atenolol (TENORMIN) tablet 50 mg (has no administration  in time range)  cholecalciferol (VITAMIN D) tablet 1,000 Units (has no administration in time range)  prazosin (MINIPRESS) capsule 15 mg (has no administration in time  range)  FLUoxetine (PROZAC) capsule 60 mg (has no administration in time range)  buPROPion (WELLBUTRIN XL) 24 hr tablet 300 mg (has no administration in time range)  pregabalin (LYRICA) capsule 300 mg (has no administration in time range)  modafinil (PROVIGIL) tablet 300 mg (has no administration in time range)  sodium chloride flush (NS) 0.9 % injection 3 mL (has no administration in time range)  sodium chloride flush (NS) 0.9 % injection 3 mL (has no administration in time range)  0.9 %  sodium chloride infusion (has no administration in time range)  ondansetron (ZOFRAN) tablet 4 mg (has no administration in time range)    Or  ondansetron (ZOFRAN) injection 4 mg (has no administration in time range)  albuterol (PROVENTIL) (2.5 MG/3ML) 0.083% nebulizer solution 2.5 mg (has no administration in time range)  albuterol (PROVENTIL) (2.5 MG/3ML) 0.083% nebulizer solution 2.5 mg (has no administration in time range)  ipratropium (ATROVENT) nebulizer solution 0.5 mg (has no administration in time range)  guaiFENesin (MUCINEX) 12 hr tablet 600 mg (has no administration in time range)  insulin aspart (novoLOG) injection 0-9 Units (has no administration in time range)  methylPREDNISolone sodium succinate (SOLU-MEDROL) 125 mg/2 mL injection 80 mg (has no administration in time range)  albuterol (PROVENTIL,VENTOLIN) solution continuous neb (10 mg/hr Nebulization Given 10/30/18 1843)  LORazepam (ATIVAN) injection 0.5 mg (0.5 mg Intravenous Given 10/30/18 1942)  magnesium sulfate IVPB 2 g 50 mL (2 g Intravenous New Bag/Given 10/30/18 2002)     Initial Impression / Assessment and Plan / ED Course  I have reviewed the triage vital signs and the nursing notes.  Pertinent labs & imaging results that were available during my care of the patient  were reviewed by me and considered in my medical decision making (see chart for details).     73 year old male past history of diabetes, PTSD who presents for evaluation via EMS for difficulty breathing.  [facility].  Had breathing treatments x2 was discharged home with prednisone, azithromycin.  At home had worsening difficulty breathing.  On EMS arrival was 84% on room air with audible wheezing and return expiratory wheezing noted throughout all lung fields.  Patient given albuterol, Atrovent, Solu-Medrol in route with some improvement.  On the initial ED arrival, patient still with audible wheezing and tachypnea.  He is speaking very short sentences.  Vital signs reviewed.  He is tachycardic O2 sats are 95% on nonrebreather.  Concern for COPD versus infectious etiology.  Low suspicion for heart failure as he does not have diffuse rales noted but he does have some pitting edema.  Will check BNP.  Patient started on continuous nebulizer.  BMP shows glucose of 261.  Otherwise unremarkable.  CBC shows leukocytosis of 15.8.  BMP is unremarkable.  Chest x-ray negative for any acute infectious etiology.  Reevaluation after continuous neb.  Patient was able to tolerate continuous neb for the full hour.  He is still having significant wheezing as well as audible wheezing.  He is speaking in short sentences and has increased work of breathing.  We will plan to start him on BiPAP, give magnesium.  Given continuously difficulty breathing, wheezing as well as failure of outpatient therapy, recommend admission for continued breathing treatments.   Patient after patient placed on BiPAP.  Still significantly with decreased air movement and wheezing noted but does seem to have better increased work of breathing.  Discussed patient with hospitalist. Will admit.   Portions  of this note were generated with Scientist, clinical (histocompatibility and immunogenetics). Dictation errors may occur  despite best attempts at proofreading.  Final Clinical Impressions(s) / ED Diagnoses   Final diagnoses:  Shortness of breath    ED Discharge Orders    None       Rosana Hoes 10/30/18 2201    Tegeler, Canary Brim, MD 11/01/18 [id number]

## 2018-10-30 NOTE — ED Notes (Signed)
Dr. Onalee Huaavid notified on pt.'s elevated CBG.

## 2018-10-31 ENCOUNTER — Encounter (HOSPITAL_COMMUNITY): Payer: Self-pay | Admitting: *Deleted

## 2018-10-31 ENCOUNTER — Other Ambulatory Visit: Payer: Self-pay

## 2018-10-31 DIAGNOSIS — J9601 Acute respiratory failure with hypoxia: Secondary | ICD-10-CM

## 2018-10-31 DIAGNOSIS — J209 Acute bronchitis, unspecified: Principal | ICD-10-CM

## 2018-10-31 DIAGNOSIS — E119 Type 2 diabetes mellitus without complications: Secondary | ICD-10-CM

## 2018-10-31 DIAGNOSIS — F4310 Post-traumatic stress disorder, unspecified: Secondary | ICD-10-CM

## 2018-10-31 LAB — RESPIRATORY PANEL BY PCR

## 2018-10-31 LAB — BASIC METABOLIC PANEL
Anion gap: 16 — ABNORMAL HIGH (ref 5–15)
BUN: 13 mg/dL (ref 8–23)
CO2: 18 mmol/L — ABNORMAL LOW (ref 22–32)
Calcium: 8.7 mg/dL — ABNORMAL LOW (ref 8.9–10.3)
Chloride: 104 mmol/L (ref 98–111)
Creatinine, Ser: 1.19 mg/dL (ref 0.61–1.24)
GFR calc Af Amer: >60 mL/min (ref >60)
GFR calc non Af Amer: >60 mL/min (ref >60)
GLUCOSE: 357 mg/dL — AB (ref 70–99)
Potassium: 4.7 mmol/L (ref 3.5–5.1)
Sodium: 138 mmol/L (ref 135–145)

## 2018-10-31 LAB — CBC
HCT: 43 % (ref 39.0–52.0)
Hemoglobin: 13.9 g/dL (ref 13.0–17.0)
MCH: 28.5 pg (ref 26.0–34.0)
MCHC: 32.3 g/dL (ref 30.0–36.0)
MCV: 88.3 fL (ref 80.0–100.0)
Platelets: 284 10*3/uL (ref 150–400)
RBC: 4.87 MIL/uL (ref 4.22–5.81)
RDW: 13.9 % (ref 11.5–15.5)
WBC: 17.6 10*3/uL — ABNORMAL HIGH (ref 4.0–10.5)
nRBC: 0 % (ref 0.0–0.2)

## 2018-10-31 LAB — GLUCOSE, CAPILLARY
Glucose-Capillary: 212 mg/dL — ABNORMAL HIGH (ref 70–99)
Glucose-Capillary: 239 mg/dL — ABNORMAL HIGH (ref 70–99)
Glucose-Capillary: 240 mg/dL — ABNORMAL HIGH (ref 70–99)

## 2018-10-31 LAB — CBG MONITORING, ED: GLUCOSE-CAPILLARY: 275 mg/dL — AB (ref 70–99)

## 2018-10-31 MED ORDER — OXYMETAZOLINE HCL 0.05 % NA SOLN
1.0000 | Freq: Two times a day (BID) | NASAL | Status: DC
  Administered 2018-10-31 – 2018-11-01 (×3): 1 via NASAL
  Filled 2018-10-31: qty 15

## 2018-10-31 MED ORDER — INSULIN ASPART 100 UNIT/ML ~~LOC~~ SOLN
0.0000 [IU] | Freq: Three times a day (TID) | SUBCUTANEOUS | Status: DC
  Administered 2018-10-31: 7 [IU] via SUBCUTANEOUS
  Administered 2018-11-01: 15 [IU] via SUBCUTANEOUS

## 2018-10-31 MED ORDER — HYDROCOD POLST-CPM POLST ER 10-8 MG/5ML PO SUER: 5.0000 mL | Freq: Two times a day (BID) | ORAL | Status: DC | PRN

## 2018-10-31 MED ORDER — LOPERAMIDE HCL 2 MG PO CAPS
2.0000 mg | ORAL_CAPSULE | ORAL | Status: DC | PRN
  Administered 2018-10-31: 2 mg via ORAL
  Filled 2018-10-31: qty 1

## 2018-10-31 MED ORDER — BUDESONIDE 0.25 MG/2ML IN SUSP
0.2500 mg | Freq: Two times a day (BID) | RESPIRATORY_TRACT | Status: DC
  Administered 2018-10-31 – 2018-11-01 (×3): 0.25 mg via RESPIRATORY_TRACT
  Filled 2018-10-31 (×4): qty 2

## 2018-10-31 MED ORDER — PANTOPRAZOLE SODIUM 40 MG PO TBEC
40.0000 mg | DELAYED_RELEASE_TABLET | Freq: Every day | ORAL | Status: DC
  Administered 2018-10-31 – 2018-11-01 (×2): 40 mg via ORAL
  Filled 2018-10-31 (×2): qty 1

## 2018-10-31 MED ORDER — BENZONATATE 100 MG PO CAPS: 200.0000 mg | ORAL_CAPSULE | Freq: Three times a day (TID) | ORAL | Status: DC | PRN

## 2018-10-31 MED ORDER — IPRATROPIUM-ALBUTEROL 0.5-2.5 (3) MG/3ML IN SOLN
3.0000 mL | Freq: Three times a day (TID) | RESPIRATORY_TRACT | Status: DC
  Administered 2018-11-01: 3 mL via RESPIRATORY_TRACT
  Filled 2018-10-31: qty 3

## 2018-10-31 MED ORDER — FLUOXETINE HCL 20 MG PO CAPS
60.0000 mg | ORAL_CAPSULE | Freq: Every day | ORAL | Status: DC
  Administered 2018-10-31 – 2018-11-01 (×2): 60 mg via ORAL
  Filled 2018-10-31 (×2): qty 3

## 2018-10-31 MED ORDER — IPRATROPIUM-ALBUTEROL 0.5-2.5 (3) MG/3ML IN SOLN
3.0000 mL | Freq: Four times a day (QID) | RESPIRATORY_TRACT | Status: DC
  Administered 2018-10-31 (×2): 3 mL via RESPIRATORY_TRACT
  Filled 2018-10-31 (×2): qty 3

## 2018-10-31 MED ORDER — INSULIN GLARGINE 100 UNIT/ML ~~LOC~~ SOLN
75.0000 [IU] | Freq: Every day | SUBCUTANEOUS | Status: DC
  Administered 2018-10-31 – 2018-11-01 (×2): 75 [IU] via SUBCUTANEOUS
  Filled 2018-10-31 (×2): qty 0.75

## 2018-10-31 MED ORDER — LORATADINE 10 MG PO TABS
10.0000 mg | ORAL_TABLET | Freq: Every day | ORAL | Status: DC
  Administered 2018-10-31 – 2018-11-01 (×2): 10 mg via ORAL
  Filled 2018-10-31 (×2): qty 1

## 2018-10-31 MED ORDER — ENOXAPARIN SODIUM 40 MG/0.4ML ~~LOC~~ SOLN
40.0000 mg | SUBCUTANEOUS | Status: DC
  Administered 2018-10-31: 40 mg via SUBCUTANEOUS
  Filled 2018-10-31 (×2): qty 0.4

## 2018-10-31 MED ORDER — FLUTICASONE PROPIONATE 50 MCG/ACT NA SUSP
2.0000 | Freq: Every day | NASAL | Status: DC
  Administered 2018-11-01: 2 via NASAL
  Filled 2018-10-31: qty 16

## 2018-10-31 MED ORDER — METHYLPREDNISOLONE SODIUM SUCC 125 MG IJ SOLR
60.0000 mg | Freq: Three times a day (TID) | INTRAMUSCULAR | Status: DC
  Administered 2018-10-31 – 2018-11-01 (×3): 60 mg via INTRAVENOUS
  Filled 2018-10-31 (×3): qty 2

## 2018-10-31 MED ORDER — METOPROLOL TARTRATE 25 MG PO TABS
25.0000 mg | ORAL_TABLET | Freq: Two times a day (BID) | ORAL | Status: DC
  Administered 2018-10-31 – 2018-11-01 (×3): 25 mg via ORAL
  Filled 2018-10-31 (×3): qty 1

## 2018-10-31 NOTE — ED Notes (Signed)
Attempted report 

## 2018-10-31 NOTE — Progress Notes (Signed)
PROGRESS NOTE        PATIENT DETAILS Name: Vincent Richmond Age: 73 y.o. Sex: male Date of Birth: 11-02-1945 Admit Date: 10/30/2018 Admitting Physician No admitting provider for patient encounter. ZOX:[id number]  Brief Narrative: Patient is a 73 y.o. male with history of hypertension, DM-2-with URI-like symptoms for at least 2 weeks-presented to the emergency room with shortness of breath-patient was found to be hypoxic with O2 saturation in 80s-and was wheezing diffusely-patient was started on BiPAP, given bronchodilators and IV steroids with significant improvement.  See below for further details  Subjective: Feels better-on BiPAP this morning-moving air well-still continues to cough  Assessment/Plan: Acute hypoxic respiratory failure secondary to acute bronchitis with bronchospasm: Improved-moving air well-talked with respiratory therapist this morning and I have liberated the patient off BiPAP.  Per nursing staff-doing well on room air.  He is still wheezing but overall improved.  Chest x-ray negative for pneumonia, will check respiratory virus panel.  Continue IV steroids, bronchodilators, Mucinex.  Encourage incentive spirometry and flutter valve.  Does complain of postnasal drip-like symptoms-hence we will start a trial of Claritin, Flonase and Afrin.  Since stable off BiPAP for more than a few hours-does not need stepdown admission-we will downgrade to MedSurg unit.  Hypertension:Stop atenolol-we will switch to metoprolol-if bronchospasm continues-we will use a more selective beta-blocker.  DM-2: Hold metformin and glipizide-resume Lantus 75 units daily-change SSI to resistant scale.  Follow CBGs and adjust accordingly.  PTSD: Appears stable-continue Prozac, Wellbutrin  OSA: Resume CPAP nightly  DVT Prophylaxis: Prophylactic Lovenox   Code Status: Full code   Family Communication: None at bedside  Disposition Plan: Remain  inpatient-transfer to MedSurg unit  Antimicrobial agents: Anti-infectives (From admission, onward)   None      Procedures: None  CONSULTS:  None  Time spent: 45 minutes-Greater than 50% of this time was spent in counseling, explanation of diagnosis, planning of further management, and coordination of care.  MEDICATIONS: Scheduled Meds: . albuterol  2.5 mg Nebulization Q6H  . aspirin EC  325 mg Oral Daily  . budesonide (PULMICORT) nebulizer solution  0.25 mg Nebulization BID  . buPROPion  300 mg Oral Daily  . cholecalciferol  1,000 Units Oral Daily  . enoxaparin (LOVENOX) injection  40 mg Subcutaneous Q24H  . FLUoxetine  60 mg Oral Daily  . fluticasone  2 spray Each Nare Daily  . guaiFENesin  600 mg Oral BID  . insulin aspart  0-9 Units Subcutaneous TID WC  . ipratropium  0.5 mg Nebulization Q6H  . loratadine  10 mg Oral Daily  . methylPREDNISolone (SOLU-MEDROL) injection  60 mg Intravenous Q8H  . metoprolol tartrate  25 mg Oral BID  . modafinil  300 mg Oral Daily  . oxymetazoline  1 spray Each Nare BID  . prazosin  15 mg Oral QHS  . pregabalin  300 mg Oral BID  . sodium chloride flush  3 mL Intravenous Q12H   Continuous Infusions: . sodium chloride     PRN Meds:.sodium chloride, albuterol, benzonatate, chlorpheniramine-HYDROcodone, ondansetron **OR** ondansetron (ZOFRAN) IV, sodium chloride flush   PHYSICAL EXAM: Vital signs: Vitals:   10/31/18 0845 10/31/18 0901 10/31/18 0914 10/31/18 0915  BP: (!) 132/56   (!) 146/56  Pulse: 73   76  Resp: 17   15  Temp:      TempSrc:  SpO2: 90% 95% 99% 99%  Weight:      Height:       Filed Weights   10/30/18 1842  Weight: 122.5 kg   Body mass index is 37.66 kg/m.   General appearance :Awake, alert, not in any distress. Speech Clear. Eyes:, pupils equally reactive to light and accomodation,no scleral icterus.Pink conjunctiva HEENT: Atraumatic and Normocephalic Neck: supple, no JVD. No cervical lymphadenopathy.  No thyromegaly Resp:Good air entry bilaterally, some coarse rhonchi all over CVS: S1 S2 regular, no murmurs.  GI: Bowel sounds present, Non tender and not distended with no gaurding, rigidity or rebound.No organomegaly Extremities: B/L Lower Ext shows no edema, both legs are warm to touch Neurology:  speech clear,Non focal, sensation is grossly intact. Psychiatric: Normal judgment and insight. Alert and oriented x 3. Normal mood. Musculoskeletal:No digital cyanosis Skin:No Rash, warm and dry Wounds:N/A  I have personally reviewed following labs and imaging studies  LABORATORY DATA: CBC: Recent Labs  Lab 10/30/18 1844 10/30/18 1956 10/31/18 0405  WBC 15.8*  --  17.6*  NEUTROABS 13.3*  --   --   HGB 14.2 11.9* 13.9  HCT 43.0 35.0* 43.0  MCV 86.9  --  88.3  PLT 265  --  284    Basic Metabolic Panel: Recent Labs  Lab 10/30/18 1844 10/30/18 1956 10/31/18 0405  NA 140 135 138  K 3.9 2.9* 4.7  CL 103  --  104  CO2 22  --  18*  GLUCOSE 261*  --  357*  BUN 12  --  13  CREATININE 1.08  --  1.19  CALCIUM 8.9  --  8.7*    GFR: Estimated Creatinine Clearance: 73.7 mL/min (by C-G formula based on SCr of 1.19 mg/dL).  Liver Function Tests: No results for input(s): AST, ALT, ALKPHOS, BILITOT, PROT, ALBUMIN in the last 168 hours. No results for input(s): LIPASE, AMYLASE in the last 168 hours. No results for input(s): AMMONIA in the last 168 hours.  Coagulation Profile: No results for input(s): INR, PROTIME in the last 168 hours.  Cardiac Enzymes: No results for input(s): CKTOTAL, CKMB, CKMBINDEX, TROPONINI in the last 168 hours.  BNP (last 3 results) No results for input(s): PROBNP in the last 8760 hours.  HbA1C: No results for input(s): HGBA1C in the last 72 hours.  CBG: Recent Labs  Lab 10/30/18 2239 10/31/18 0734  GLUCAP 344* 275*    Lipid Profile: No results for input(s): CHOL, HDL, LDLCALC, TRIG, CHOLHDL, LDLDIRECT in the last 72 hours.  Thyroid Function  Tests: No results for input(s): TSH, T4TOTAL, FREET4, T3FREE, THYROIDAB in the last 72 hours.  Anemia Panel: No results for input(s): VITAMINB12, FOLATE, FERRITIN, TIBC, IRON, RETICCTPCT in the last 72 hours.  Urine analysis: No results found for: COLORURINE, APPEARANCEUR, LABSPEC, PHURINE, GLUCOSEU, HGBUR, BILIRUBINUR, KETONESUR, PROTEINUR, UROBILINOGEN, NITRITE, LEUKOCYTESUR  Sepsis Labs: Lactic Acid, Venous    Component Value Date/Time   LATICACIDVEN 1.60 02/17/2015 2222    MICROBIOLOGY: No results found for this or any previous visit (from the past 240 hour(s)).  RADIOLOGY STUDIES/RESULTS: Dg Chest Portable 1 View  Result Date: 10/30/2018 CLINICAL DATA:  Cough and increasing shortness of Breath EXAM: PORTABLE CHEST 1 VIEW COMPARISON:  02/17/2015 FINDINGS: Cardiac shadow is at the upper limits of normal in size. The lungs are well aerated bilaterally. No focal infiltrate or sizable effusion is seen. Degenerative changes of the thoracic spine are again seen. No other focal abnormality is noted. IMPRESSION: No acute abnormality seen. Electronically Signed  By: Alcide Clever M.D.   On: 10/30/2018 19:31     LOS: 1 day   Jeoffrey Massed, MD  [facility]  If 7PM-7AM, please contact night-coverage  Please page via [email or web address]-Password [email or web address]-click on MD name and type text message  10/31/2018, 10:45 AM

## 2018-10-31 NOTE — ED Notes (Signed)
CBG 275 

## 2018-10-31 NOTE — ED Notes (Signed)
Pt remains off bipap and room air , sats remain 95 %

## 2018-11-01 DIAGNOSIS — I10 Essential (primary) hypertension: Secondary | ICD-10-CM

## 2018-11-01 LAB — BASIC METABOLIC PANEL
Anion gap: 13 (ref 5–15)
BUN: 22 mg/dL (ref 8–23)
CO2: 22 mmol/L (ref 22–32)
Calcium: 8.4 mg/dL — ABNORMAL LOW (ref 8.9–10.3)
Chloride: 101 mmol/L (ref 98–111)
Creatinine, Ser: 0.96 mg/dL (ref 0.61–1.24)
GFR calc Af Amer: >60 mL/min (ref >60)
GFR calc non Af Amer: >60 mL/min (ref >60)
Glucose, Bld: 314 mg/dL — ABNORMAL HIGH (ref 70–99)
Potassium: 4.8 mmol/L (ref 3.5–5.1)
Sodium: 136 mmol/L (ref 135–145)

## 2018-11-01 LAB — CBC
HCT: 39.1 % (ref 39.0–52.0)
Hemoglobin: 12.9 g/dL — ABNORMAL LOW (ref 13.0–17.0)
MCH: 28.7 pg (ref 26.0–34.0)
MCHC: 33 g/dL (ref 30.0–36.0)
MCV: 86.9 fL (ref 80.0–100.0)
Platelets: 268 10*3/uL (ref 150–400)
RBC: 4.5 MIL/uL (ref 4.22–5.81)
RDW: 14 % (ref 11.5–15.5)
WBC: 21.1 10*3/uL — ABNORMAL HIGH (ref 4.0–10.5)
nRBC: 0 % (ref 0.0–0.2)

## 2018-11-01 LAB — GLUCOSE, CAPILLARY: Glucose-Capillary: 306 mg/dL — ABNORMAL HIGH (ref 70–99)

## 2018-11-01 MED ORDER — GUAIFENESIN ER 600 MG PO TB12: 600.0000 mg | ORAL_TABLET | Freq: Two times a day (BID) | ORAL | 0 refills | Status: AC

## 2018-11-01 MED ORDER — METOPROLOL TARTRATE 25 MG PO TABS: 25.0000 mg | ORAL_TABLET | Freq: Two times a day (BID) | ORAL | 0 refills | Status: DC

## 2018-11-01 MED ORDER — OXYMETAZOLINE HCL 0.05 % NA SOLN: 1.0000 | Freq: Two times a day (BID) | NASAL | 0 refills | Status: AC

## 2018-11-01 MED ORDER — BUDESONIDE-FORMOTEROL FUMARATE 160-4.5 MCG/ACT IN AERO: 2.0000 | INHALATION_SPRAY | Freq: Two times a day (BID) | RESPIRATORY_TRACT | 0 refills | Status: DC

## 2018-11-01 MED ORDER — ALBUTEROL SULFATE HFA 108 (90 BASE) MCG/ACT IN AERS: 2.0000 | INHALATION_SPRAY | Freq: Four times a day (QID) | RESPIRATORY_TRACT | 2 refills | Status: DC | PRN

## 2018-11-01 MED ORDER — LORATADINE 10 MG PO TABS: 10.0000 mg | ORAL_TABLET | Freq: Every day | ORAL | 0 refills | Status: DC

## 2018-11-01 MED ORDER — BENZONATATE 200 MG PO CAPS: 200.0000 mg | ORAL_CAPSULE | Freq: Three times a day (TID) | ORAL | 0 refills | Status: DC | PRN

## 2018-11-01 MED ORDER — PREDNISONE 10 MG PO TABS: ORAL_TABLET | ORAL | 0 refills | Status: DC

## 2018-11-01 MED ORDER — FLUTICASONE PROPIONATE 50 MCG/ACT NA SUSP: 2.0000 | Freq: Every day | NASAL | 0 refills | Status: DC

## 2018-11-01 NOTE — Plan of Care (Signed)
  Problem: Health Behavior/Discharge Planning: Goal: Ability to manage health-related needs will improve Outcome: Progressing   

## 2018-11-01 NOTE — Progress Notes (Signed)
Pt given oral and written discharge instructions and prescriptions. Also given daily meds from pharmacy bin. Volunteer called to assist with discharge to car.

## 2018-11-01 NOTE — Discharge Summary (Signed)
PATIENT DETAILS Name: Vincent FramesWilliam Goforth Age: 73 y.o. Sex: male Date of Birth: 1946-09-25 MRN: [id number]. Admitting Physician: Maretta BeesShanker M Jamai Dolce, MD WUJ:[id number]:[facility]  Admit Date: 10/30/2018 Discharge date: 11/01/2018  Recommendations for Outpatient Follow-up:  1. Follow up with PCP in 1-2 weeks 2. Please obtain BMP/CBC in one week  Admitted From:  Home  Disposition: Home   Home Health: No  Equipment/Devices: None  Discharge Condition: Stable  CODE STATUS: FULL CODE  Diet recommendation:  Heart Healthy / Carb Modified   Brief Summary: See H&P, Labs, Consult and Test reports for all details in brief, Patient is a 73 y.o. male with history of hypertension, DM-2-with URI-like symptoms for at least 2 weeks-presented to the emergency room with shortness of breath-patient was found to be hypoxic with O2 saturation in 80s-and was wheezing diffusely-patient was started on BiPAP, given bronchodilators and IV steroids with significant improvement.  See below for further details  Brief Hospital Course: Acute hypoxic respiratory failure secondary to acute bronchitis with bronchospasm: Rapidly improved-initially on admission-was in acute distress and hypoxic with O2 saturation in the 80s-he required BiPAP support.  He was started on IV steroids, scheduled bronchodilators and other supportive care.  On 1/26, he was able to be liberated off BiPAP due to clinical improvement.  This morning-he feels markedly better-and is actually requesting discharge.  He is not wheezing-and his O2 saturations are stable on room air.  Respiratory virus panel was negative.  I suspect he did have some postnasal drip contributing to his bronchospastic episodes.  He continues to have cough which will likely get better over the next few weeks.  On discharge-we will provide tapering prednisone, as needed albuterol inhaler-and Symbicort inhaler for the next few weeks.  He will be continued on Claritin, Afrin  (for 4 more doses) and Flonase for postnasal drip.  I have asked him to follow with his primary care practitioner within a week.   Hypertension: Given significant bronchospasm-we switched him to metoprolol-this will be continued on discharge.  I have discontinued atenolol.  If in the future, he were to continue to have bronchospastic episodes-consider switching to a more selective beta-blocker.    DM-2: CBG stable-resume Lantus and metformin on discharge.  Follow-up with PCP for further optimization   PTSD: Appears stable-continue Prozac, Wellbutrin  OSA: Resume CPAP nightly  Procedures/Studies: None  Discharge Diagnoses:  Principal Problem:   Acute respiratory failure with hypoxia (HCC) Active Problems:   PTSD (post-traumatic stress disorder)   Diabetes mellitus without complication (HCC)   Hypertension   Bronchitis with bronchospasm   Acute respiratory failure (HCC)   Acute bronchitis   Discharge Instructions:  Activity:  As tolerated   Discharge Instructions    Diet - low sodium heart healthy   Complete by:  As directed    Diet general   Complete by:  As directed    Discharge instructions   Complete by:  As directed    Follow with Primary MD  [facility]  Please get a complete blood count and chemistry panel checked by your Primary MD at your next visit, and again as instructed by your Primary MD.  Get Medicines reviewed and adjusted: Please take all your medications with you for your next visit with your Primary MD  Laboratory/radiological data: Please request your Primary MD to go over all hospital tests and procedure/radiological results at the follow up, please ask your Primary MD to get all Hospital records sent to his/her office.  In some cases, they will be blood work, cultures and biopsy results pending at the time of your discharge. Please request that your primary care M.D. follows up on these results.  Also Note the  following: If you experience worsening of your admission symptoms, develop shortness of breath, life threatening emergency, suicidal or homicidal thoughts you must seek medical attention immediately by calling 911 or calling your MD immediately  if symptoms less severe.  You must read complete instructions/literature along with all the possible adverse reactions/side effects for all the Medicines you take and that have been prescribed to you. Take any new Medicines after you have completely understood and accpet all the possible adverse reactions/side effects.   Do not drive when taking Pain medications or sleeping medications (Benzodaizepines)  Do not take more than prescribed Pain, Sleep and Anxiety Medications. It is not advisable to combine anxiety,sleep and pain medications without talking with your primary care practitioner  Special Instructions: If you have smoked or chewed Tobacco  in the last 2 yrs please stop smoking, stop any regular Alcohol  and or any Recreational drug use.  Wear Seat belts while driving.  Please note: You were cared for by a hospitalist during your hospital stay. Once you are discharged, your primary care physician will handle any further medical issues. Please note that NO REFILLS for any discharge medications will be authorized once you are discharged, as it is imperative that you return to your primary care physician (or establish a relationship with a primary care physician if you do not have one) for your post hospital discharge needs so that they can reassess your need for medications and monitor your lab values.   Increase activity slowly   Complete by:  As directed      Allergies as of 11/01/2018      Reactions   Statins Other (See Comments)   Severe muscle pains      Medication List    STOP taking these medications   atenolol 50 MG tablet Commonly known as:  TENORMIN     TAKE these medications   albuterol 108 (90 Base) MCG/ACT inhaler Commonly  known as:  PROVENTIL HFA;VENTOLIN HFA Inhale 2 puffs into the lungs every 6 (six) hours as needed for wheezing or shortness of breath.   aspirin EC 325 MG tablet Take 325 mg by mouth daily.   benzonatate 200 MG capsule Commonly known as:  TESSALON Take 1 capsule (200 mg total) by mouth 3 (three) times daily as needed for cough.   budesonide-formoterol 160-4.5 MCG/ACT inhaler Commonly known as:  SYMBICORT Inhale 2 puffs into the lungs 2 (two) times daily.   buPROPion 300 MG 24 hr tablet Commonly known as:  WELLBUTRIN XL Take 300 mg by mouth daily.   cholecalciferol 1000 units tablet Commonly known as:  VITAMIN D Take 1,000 Units by mouth daily.   Fish Oil 1000 MG Caps Take 1,000 mg by mouth daily.   FLUoxetine 20 MG capsule Commonly known as:  PROZAC Take 60 mg by mouth daily. For depression and anxiety   fluticasone 50 MCG/ACT nasal spray Commonly known as:  FLONASE Place 2 sprays into both nostrils daily.   glipiZIDE 5 MG tablet Commonly known as:  GLUCOTROL Take 10 mg by mouth 2 (two) times daily.   guaiFENesin 600 MG 12 hr tablet Commonly known as:  MUCINEX Take 1 tablet (600 mg total) by mouth 2 (two) times daily for 15 doses.   HYDROcodone-acetaminophen 5-325 MG tablet Commonly known  as:  NORCO/VICODIN Take 2 tablets by mouth every 6 (six) hours as needed.   insulin glargine 100 unit/mL Sopn Commonly known as:  LANTUS Inject 75 Units into the skin daily.   loperamide 2 MG capsule Commonly known as:  IMODIUM Take 2 mg by mouth daily.   loratadine 10 MG tablet Commonly known as:  CLARITIN Take 1 tablet (10 mg total) by mouth daily.   metFORMIN 1000 MG tablet Commonly known as:  GLUCOPHAGE Take 1,000 mg by mouth 2 (two) times daily with a meal.   metoprolol tartrate 25 MG tablet Commonly known as:  LOPRESSOR Take 1 tablet (25 mg total) by mouth 2 (two) times daily.   modafinil 100 MG tablet Commonly known as:  PROVIGIL Take 300 mg by mouth daily.    NON FORMULARY CPAP Machine daily   omeprazole 20 MG capsule Commonly known as:  PRILOSEC Take 20 mg by mouth daily.   oxymetazoline 0.05 % nasal spray Commonly known as:  AFRIN Place 1 spray into both nostrils 2 (two) times daily for 4 doses.   prazosin 5 MG capsule Commonly known as:  MINIPRESS Take 15 mg by mouth at bedtime. For nightmares and flashbacks   predniSONE 10 MG tablet Commonly known as:  DELTASONE Take 4 tablets (40 mg) daily for 2 days, then, Take 3 tablets (30 mg) daily for 2 days, then, Take 2 tablets (20 mg) daily for 2 days, then, Take 1 tablets (10 mg) daily for 1 days, then stop   pregabalin 300 MG capsule Commonly known as:  LYRICA Take 300 mg by mouth 2 (two) times daily.   VITAMIN C PO Take 100 mg by mouth daily.      Follow-up Information    [facility] Schedule an appointment as soon as possible for a visit in 1 week(s).   Contact information: [address] [facility] [place] [address] [phone]          Allergies  Allergen Reactions  . Statins Other (See Comments)    Severe muscle pains    Consultations:   None   Other Procedures/Studies: Dg Chest Portable 1 View  Result Date: 10/30/2018 CLINICAL DATA:  Cough and increasing shortness of Breath EXAM: PORTABLE CHEST 1 VIEW COMPARISON:  02/17/2015 FINDINGS: Cardiac shadow is at the upper limits of normal in size. The lungs are well aerated bilaterally. No focal infiltrate or sizable effusion is seen. Degenerative changes of the thoracic spine are again seen. No other focal abnormality is noted. IMPRESSION: No acute abnormality seen. Electronically Signed   By: Alcide Clever M.D.   On: 10/30/2018 19:31     TODAY-DAY OF DISCHARGE:  Subjective:   Vincent Richmond today has no headache,no chest abdominal pain,no new weakness tingling or numbness, feels much better wants to go home today.   Objective:   Blood pressure (!) 117/59, pulse 63, temperature  98.2 F (36.8 C), temperature source Axillary, resp. rate 16, height 5\' 11"  (1.803 m), weight 122.5 kg, SpO2 91 %.  Intake/Output Summary (Last 24 hours) at 11/01/2018 0849 Last data filed at 10/31/2018 1700 Gross per 24 hour  Intake 720 ml  Output -  Net 720 ml   Filed Weights   10/30/18 1842  Weight: 122.5 kg    Exam: Awake Alert, Oriented *3, No new F.N deficits, Normal affect [place].AT,PERRAL Supple Neck,No JVD, No cervical lymphadenopathy appriciated.  Symmetrical Chest wall movement, Good air movement bilaterally, CTAB RRR,No Gallops,Rubs or new Murmurs, No Parasternal Heave +ve B.Sounds,  Abd Soft, Non tender, No organomegaly appriciated, No rebound -guarding or rigidity. No Cyanosis, Clubbing or edema, No new Rash or bruise   PERTINENT RADIOLOGIC STUDIES: Dg Chest Portable 1 View  Result Date: 10/30/2018 CLINICAL DATA:  Cough and increasing shortness of Breath EXAM: PORTABLE CHEST 1 VIEW COMPARISON:  02/17/2015 FINDINGS: Cardiac shadow is at the upper limits of normal in size. The lungs are well aerated bilaterally. No focal infiltrate or sizable effusion is seen. Degenerative changes of the thoracic spine are again seen. No other focal abnormality is noted. IMPRESSION: No acute abnormality seen. Electronically Signed   By: Alcide Clever M.D.   On: 10/30/2018 19:31     PERTINENT LAB RESULTS: CBC: Recent Labs    10/31/18 0405 11/01/18 0435  WBC 17.6* 21.1*  HGB 13.9 12.9*  HCT 43.0 39.1  PLT 284 268   CMET CMP     Component Value Date/Time   NA 136 11/01/2018 0435   K 4.8 11/01/2018 0435   CL 101 11/01/2018 0435   CO2 22 11/01/2018 0435   GLUCOSE 314 (H) 11/01/2018 0435   BUN 22 11/01/2018 0435   CREATININE 0.96 11/01/2018 0435   CALCIUM 8.4 (L) 11/01/2018 0435   GFRNONAA >60 11/01/2018 0435   GFRAA >60 11/01/2018 0435    GFR Estimated Creatinine Clearance: 91.3 mL/min (by C-G formula based on SCr of 0.96 mg/dL). No results for input(s): LIPASE, AMYLASE in  the last 72 hours. No results for input(s): CKTOTAL, CKMB, CKMBINDEX, TROPONINI in the last 72 hours. Invalid input(s): POCBNP No results for input(s): DDIMER in the last 72 hours. No results for input(s): HGBA1C in the last 72 hours. No results for input(s): CHOL, HDL, LDLCALC, TRIG, CHOLHDL, LDLDIRECT in the last 72 hours. No results for input(s): TSH, T4TOTAL, T3FREE, THYROIDAB in the last 72 hours.  Invalid input(s): FREET3 No results for input(s): VITAMINB12, FOLATE, FERRITIN, TIBC, IRON, RETICCTPCT in the last 72 hours. Coags: No results for input(s): INR in the last 72 hours.  Invalid input(s): PT Microbiology: Recent Results (from the past 240 hour(s))  Respiratory Panel by PCR     Status: None   Collection Time: 10/31/18  2:26 PM  Result Value Ref Range Status   Adenovirus NOT DETECTED NOT DETECTED Final   Coronavirus 229E NOT DETECTED NOT DETECTED Final   Coronavirus HKU1 NOT DETECTED NOT DETECTED Final   Coronavirus NL63 NOT DETECTED NOT DETECTED Final   Coronavirus OC43 NOT DETECTED NOT DETECTED Final   Metapneumovirus NOT DETECTED NOT DETECTED Final   Rhinovirus / Enterovirus NOT DETECTED NOT DETECTED Final   Influenza A NOT DETECTED NOT DETECTED Final   Influenza B NOT DETECTED NOT DETECTED Final   Parainfluenza Virus 1 NOT DETECTED NOT DETECTED Final   Parainfluenza Virus 2 NOT DETECTED NOT DETECTED Final   Parainfluenza Virus 3 NOT DETECTED NOT DETECTED Final   Parainfluenza Virus 4 NOT DETECTED NOT DETECTED Final   Respiratory Syncytial Virus NOT DETECTED NOT DETECTED Final   Bordetella pertussis NOT DETECTED NOT DETECTED Final   Chlamydophila pneumoniae NOT DETECTED NOT DETECTED Final   Mycoplasma pneumoniae NOT DETECTED NOT DETECTED Final    Comment: Performed at [facility], [address] [place], [place] [address]    FURTHER DISCHARGE INSTRUCTIONS:  Get Medicines reviewed and adjusted: Please take all your medications with you for your next  visit with your Primary MD  Laboratory/radiological data: Please request your Primary MD to go over all hospital tests and procedure/radiological results at  the follow up, please ask your Primary MD to get all Hospital records sent to his/her office.  In some cases, they will be blood work, cultures and biopsy results pending at the time of your discharge. Please request that your primary care M.D. goes through all the records of your hospital data and follows up on these results.  Also Note the following: If you experience worsening of your admission symptoms, develop shortness of breath, life threatening emergency, suicidal or homicidal thoughts you must seek medical attention immediately by calling 911 or calling your MD immediately  if symptoms less severe.  You must read complete instructions/literature along with all the possible adverse reactions/side effects for all the Medicines you take and that have been prescribed to you. Take any new Medicines after you have completely understood and accpet all the possible adverse reactions/side effects.   Do not drive when taking Pain medications or sleeping medications (Benzodaizepines)  Do not take more than prescribed Pain, Sleep and Anxiety Medications. It is not advisable to combine anxiety,sleep and pain medications without talking with your primary care practitioner  Special Instructions: If you have smoked or chewed Tobacco  in the last 2 yrs please stop smoking, stop any regular Alcohol  and or any Recreational drug use.  Wear Seat belts while driving.  Please note: You were cared for by a hospitalist during your hospital stay. Once you are discharged, your primary care physician will handle any further medical issues. Please note that NO REFILLS for any discharge medications will be authorized once you are discharged, as it is imperative that you return to your primary care physician (or establish a relationship with a primary care  physician if you do not have one) for your post hospital discharge needs so that they can reassess your need for medications and monitor your lab values.  Total Time spent coordinating discharge including counseling, education and face to face time equals 35 minutes.  SignedJeoffrey Massed: Milbert Bixler 11/01/2018 8:49 AM

## 2018-11-01 NOTE — Progress Notes (Signed)
Rt called to give tx to pt for wheezing. RT arrived to find pt in no distress and BS were clear in upper lobes and slightly diminished in lower lobes.  RT did note upper airway "squeak" (possibly stridor), but good air movement was also noted.  RT will monitor for need of racemic at a later time.

## 2018-11-01 NOTE — Plan of Care (Signed)
  Problem: Pain Managment: Goal: General experience of comfort will improve Outcome: Progressing   Problem: Safety: Goal: Ability to remain free from injury will improve Outcome: Progressing   Problem: Skin Integrity: Goal: Risk for impaired skin integrity will decrease Outcome: Progressing   

## 2018-12-10 ENCOUNTER — Telehealth: Payer: Self-pay | Admitting: Pulmonary Disease

## 2018-12-10 NOTE — Telephone Encounter (Signed)
Message was opened in error//kob

## 2018-12-10 NOTE — Telephone Encounter (Signed)
Will close message.  

## 2018-12-20 ENCOUNTER — Ambulatory Visit (INDEPENDENT_AMBULATORY_CARE_PROVIDER_SITE_OTHER): Payer: No Typology Code available for payment source | Admitting: Emergency Medicine

## 2018-12-20 ENCOUNTER — Encounter: Payer: Self-pay | Admitting: Emergency Medicine

## 2018-12-20 ENCOUNTER — Other Ambulatory Visit: Payer: Self-pay

## 2018-12-20 DIAGNOSIS — J9601 Acute respiratory failure with hypoxia: Secondary | ICD-10-CM | POA: Diagnosis not present

## 2018-12-20 DIAGNOSIS — J449 Chronic obstructive pulmonary disease, unspecified: Secondary | ICD-10-CM

## 2018-12-20 DIAGNOSIS — R7611 Nonspecific reaction to tuberculin skin test without active tuberculosis: Secondary | ICD-10-CM | POA: Insufficient documentation

## 2018-12-20 DIAGNOSIS — G4733 Obstructive sleep apnea (adult) (pediatric): Secondary | ICD-10-CM

## 2018-12-20 MED ORDER — TIOTROPIUM BROMIDE-OLODATEROL 2.5-2.5 MCG/ACT IN AERS: 2.0000 | INHALATION_SPRAY | Freq: Every day | RESPIRATORY_TRACT | 0 refills | Status: DC

## 2018-12-20 NOTE — Assessment & Plan Note (Signed)
Presumed COPD based on his symptoms, recent exacerbation, tobacco history.  We will try to confirm this with pulmonary function testing.  In the meantime I will start him on Stiolto (he just finished a sample of Symbicort) to see if he gets benefit.  Continue his albuterol as needed.

## 2018-12-20 NOTE — Assessment & Plan Note (Signed)
With no history of treatment, no history of active tuberculosis.  Normal chest x-ray 10/30/2018 reviewed by me.  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  I did mention this to him today.

## 2018-12-20 NOTE — Progress Notes (Signed)
Patient seen in the office today and instructed on use of Stiolto Respimat.  Patient expressed understanding and demonstrated technique.  

## 2018-12-20 NOTE — Assessment & Plan Note (Signed)
On CPAP, managed by the PA.  Good compliance although his recent illness and coughing did make it challenging for several weeks.

## 2018-12-20 NOTE — Patient Instructions (Addendum)
We will stop Symbicort for now Try starting Stiolto 2 puffs once daily.  Keep track of whether this medication helps you.  If so we will try to obtain it for you through the [place]. Keep albuterol (Ventolin) available to use 2 puffs up to every 4 hours if needed for shortness of breath, chest tightness, wheezing. Please continue your fluticasone nasal spray, 2 sprays each nostril once daily Try restarting loratadine 10 mg (Claritin) once daily. We will perform full pulmonary function testing at your next visit. Continue to use your CPAP every night. We will discuss the potential benefits of taking a single antibiotic since you have a history of a positive TB skin test.  This could possibly decrease your risk for developing active tuberculosis someday. Follow with Dr. Delton Coombes next available with full pulmonary function testing on the same day.

## 2018-12-20 NOTE — Progress Notes (Signed)
Subjective:    Patient ID: Vincent Richmond, male    DOB: 06-01-1946, 73 y.o.   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  He is followed at the [place], is referred today for evaluation of presumed COPD, continued symptoms.  He was admitted to [facility] with an apparent acute exacerbation of COPD with associated hypoxemic respiratory failure in late January of this year. Even after d/c he continued to have cough, a bit better over the last week.  Currently managed on Symbicort + albuterol (new), Flonase prn, Claritin prn, omeprazole.   Review of Systems  Constitutional: Negative for activity change, appetite change, chills, diaphoresis, fatigue, fever and unexpected weight change.  HENT: Positive for postnasal drip, sore throat and trouble swallowing. Negative for congestion, dental problem, nosebleeds, rhinorrhea, sinus pressure, sneezing and voice change.   Eyes: Negative for itching and visual disturbance.  Respiratory: Positive for cough, shortness of breath and wheezing. Negative for choking, chest tightness and stridor.   Cardiovascular: Positive for chest pain and palpitations. Negative for leg swelling.  Gastrointestinal: Negative for abdominal pain.  Musculoskeletal: Negative for joint swelling and myalgias.  Skin: Negative for rash.  Neurological: Positive for headaches. Negative for syncope and light-headedness.  Psychiatric/Behavioral: Positive for agitation and dysphoric mood. Negative for sleep disturbance.     Past Medical History:  Diagnosis Date  . Diabetes mellitus without complication (HCC)   . Hypertension   . PTSD (post-traumatic stress disorder)   . Stroke [facility])      Family History  Problem Relation Age of Onset  . Heart disease Father   . Cancer Father      Social History   Socioeconomic History  . Marital status: Married     Spouse name: Not on file  . Number of children: Not on file  . Years of education: Not on file  . Highest education level: Not on file  Occupational History  . Not on file  Social Needs  . Financial resource strain: Not on file  . Food insecurity:    Worry: Not on file    Inability: Not on file  . Transportation needs:    Medical: Not on file    Non-medical: Not on file  Tobacco Use  . Smoking status: Former Smoker    Packs/day: 1.00    Years: 20.00    Pack years: 20.00    Types: Cigarettes    Last attempt to quit: 10/07/1975    Years since quitting: 43.2  . Smokeless tobacco: Never Used  Substance and Sexual Activity  . Alcohol use: No  . Drug use: Never  . Sexual activity: Yes  Lifestyle  . Physical activity:    Days per week: Not on file    Minutes per session: Not on file  . Stress: Not on file  Relationships  . Social connections:    Talks on phone: Not on file    Gets together: Not on file    Attends religious service: Not on file    Active member of club or organization: Not on file    Attends meetings of clubs or organizations: Not on file    Relationship status: Not on file  . Intimate partner violence:    Fear of current or ex partner: Not on file    Emotionally abused: Not on file    Physically abused: Not  on file    Forced sexual activity: Not on file  Other Topics Concern  . Not on file  Social History Narrative  . Not on file    [employer] veteran, spent time in [place] and was exposed to agent orange.  Allergies  Allergen Reactions  . Statins Other (See Comments)    Severe muscle pains     Outpatient Medications Prior to Visit  Medication Sig Dispense Refill  . albuterol (PROVENTIL HFA;VENTOLIN HFA) 108 (90 Base) MCG/ACT inhaler Inhale 2 puffs into the lungs every 6 (six) hours as needed for wheezing or shortness of breath. 1 Inhaler 2  . Ascorbic Acid (VITAMIN C PO) Take 100 mg by mouth daily.    Marland Kitchen aspirin EC 325 MG tablet Take 325 mg by mouth  daily.    . benzonatate (TESSALON) 200 MG capsule Take 1 capsule (200 mg total) by mouth 3 (three) times daily as needed for cough. 20 capsule 0  . budesonide-formoterol (SYMBICORT) 160-4.5 MCG/ACT inhaler Inhale 2 puffs into the lungs 2 (two) times daily. 1 Inhaler 0  . buPROPion (WELLBUTRIN XL) 300 MG 24 hr tablet Take 300 mg by mouth daily.    . cholecalciferol (VITAMIN D) 1000 UNITS tablet Take 1,000 Units by mouth daily.    Marland Kitchen FLUoxetine (PROZAC) 20 MG capsule Take 60 mg by mouth daily. For depression and anxiety     . fluticasone (FLONASE) 50 MCG/ACT nasal spray Place 2 sprays into both nostrils daily. 16 g 0  . glipiZIDE (GLUCOTROL) 5 MG tablet Take 10 mg by mouth 2 (two) times daily.     Marland Kitchen HYDROcodone-acetaminophen (NORCO/VICODIN) 5-325 MG per tablet Take 2 tablets by mouth every 6 (six) hours as needed. (Patient not taking: Reported on 10/30/2018) 16 tablet 0  . insulin glargine (LANTUS) 100 unit/mL SOPN Inject 75 Units into the skin daily.     Marland Kitchen loperamide (IMODIUM) 2 MG capsule Take 2 mg by mouth daily.    Marland Kitchen loratadine (CLARITIN) 10 MG tablet Take 1 tablet (10 mg total) by mouth daily. 15 tablet 0  . metFORMIN (GLUCOPHAGE) 1000 MG tablet Take 1,000 mg by mouth 2 (two) times daily with a meal.    . metoprolol tartrate (LOPRESSOR) 25 MG tablet Take 1 tablet (25 mg total) by mouth 2 (two) times daily. 60 tablet 0  . modafinil (PROVIGIL) 100 MG tablet Take 300 mg by mouth daily.    . NON FORMULARY CPAP Machine daily    . Omega-3 Fatty Acids (FISH OIL) 1000 MG CAPS Take 1,000 mg by mouth daily.     Marland Kitchen omeprazole (PRILOSEC) 20 MG capsule Take 20 mg by mouth daily.    . prazosin (MINIPRESS) 5 MG capsule Take 15 mg by mouth at bedtime. For nightmares and flashbacks     . pregabalin (LYRICA) 300 MG capsule Take 300 mg by mouth 2 (two) times daily.    . predniSONE (DELTASONE) 10 MG tablet Take 4 tablets (40 mg) daily for 2 days, then, Take 3 tablets (30 mg) daily for 2 days, then, Take 2 tablets  (20 mg) daily for 2 days, then, Take 1 tablets (10 mg) daily for 1 days, then stop 19 tablet 0   No facility-administered medications prior to visit.         Objective:   Physical Exam Vitals:   12/20/18 1051  BP: (!) 158/72  Pulse: 68  SpO2: 97%  Weight: 273 lb (123.8 kg)  Height:  (1.803 m)   Gen: Pleasant,  obese man, in no distress,  normal affect  ENT: No lesions,  mouth clear,  oropharynx clear, no postnasal drip  Neck: No JVD, no stridor  Lungs: No use of accessory muscles, no crackles or wheezing on normal respiration, no wheeze on forced expiration  Cardiovascular: RRR, heart sounds normal, no murmur or gallops, no peripheral edema  Musculoskeletal: No deformities, no cyanosis or clubbing  Neuro: alert, awake, non focal  Skin: Warm, no lesions or rash      Assessment & Plan:  COPD (chronic obstructive pulmonary disease) (HCC) Presumed COPD based on his symptoms, recent exacerbation, tobacco history.  We will try to confirm this with pulmonary function testing.  In the meantime I will start him on Stiolto (he just finished a sample of Symbicort) to see if he gets benefit.  Continue his albuterol as needed.  Acute respiratory failure with hypoxia (HCC) We will recheck a walking oximetry at his next visit.  OSA (obstructive sleep apnea) On CPAP, managed by the PA.  Good compliance although his recent illness and coughing did make it challenging for several weeks.  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  Normal chest x-ray 10/30/2018 reviewed by me.  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  I did mention this to him today.  Levy Pupa, MD, PhD 12/20/2018, 11:23 AM  Pulmonary and Critical Care [phone] or if no answer [phone]

## 2018-12-20 NOTE — Assessment & Plan Note (Signed)
We will recheck a walking oximetry at his next visit.

## 2018-12-22 ENCOUNTER — Institutional Professional Consult (permissible substitution): Payer: Medicare HMO | Admitting: Pulmonary Disease

## 2018-12-28 ENCOUNTER — Telehealth: Payer: Self-pay | Admitting: *Deleted

## 2018-12-28 NOTE — Telephone Encounter (Signed)
Pt seen 3/16 and Symbicort d/c. Pt spouse states since trial of Stiolto. Pt c/o nausea and dry heves. Please advise.

## 2018-12-28 NOTE — Telephone Encounter (Signed)
Please have him stop stiolto and change back to symbicort. Call us if still having sx.

## 2018-12-29 MED ORDER — BUDESONIDE-FORMOTEROL FUMARATE 160-4.5 MCG/ACT IN AERO: 2.0000 | INHALATION_SPRAY | Freq: Two times a day (BID) | RESPIRATORY_TRACT | 0 refills | Status: DC

## 2018-12-29 MED ORDER — BUDESONIDE-FORMOTEROL FUMARATE 160-4.5 MCG/ACT IN AERO: 2.0000 | INHALATION_SPRAY | Freq: Two times a day (BID) | RESPIRATORY_TRACT | 3 refills | Status: DC

## 2018-12-29 NOTE — Addendum Note (Signed)
Addended by: Maurene Capes on: 12/29/2018 02:04 PM   Modules accepted: Orders

## 2018-12-29 NOTE — Telephone Encounter (Signed)
Called and spoke with patient's wife, she is aware of response and verbalized understanding. Asked for refill of symbicort. Refill sent. Nothing further needed.

## 2019-01-07 ENCOUNTER — Ambulatory Visit: Payer: Medicare HMO | Admitting: Emergency Medicine

## 2019-03-25 ENCOUNTER — Other Ambulatory Visit: Payer: Self-pay | Admitting: Emergency Medicine

## 2019-03-31 ENCOUNTER — Other Ambulatory Visit (HOSPITAL_COMMUNITY)
Admission: RE | Admit: 2019-03-31 | Discharge: 2019-03-31 | Disposition: A | Discharge status: Home / Self Care | Payer: No Typology Code available for payment source | Source: Ambulatory Visit | Attending: Emergency Medicine | Admitting: Emergency Medicine

## 2019-03-31 DIAGNOSIS — Z1159 Encounter for screening for other viral diseases: Secondary | ICD-10-CM | POA: Insufficient documentation

## 2019-03-31 LAB — SARS CORONAVIRUS 2 (TAT 6-24 HRS): SARS Coronavirus 2: NEGATIVE

## 2019-04-01 ENCOUNTER — Other Ambulatory Visit: Payer: Self-pay | Admitting: *Deleted

## 2019-04-01 DIAGNOSIS — J449 Chronic obstructive pulmonary disease, unspecified: Secondary | ICD-10-CM

## 2019-04-04 ENCOUNTER — Encounter: Payer: Self-pay | Admitting: Pulmonary Disease

## 2019-04-04 ENCOUNTER — Ambulatory Visit (INDEPENDENT_AMBULATORY_CARE_PROVIDER_SITE_OTHER): Payer: No Typology Code available for payment source | Admitting: Emergency Medicine

## 2019-04-04 ENCOUNTER — Ambulatory Visit (INDEPENDENT_AMBULATORY_CARE_PROVIDER_SITE_OTHER): Payer: No Typology Code available for payment source | Admitting: Pulmonary Disease

## 2019-04-04 ENCOUNTER — Other Ambulatory Visit: Payer: Self-pay

## 2019-04-04 DIAGNOSIS — J449 Chronic obstructive pulmonary disease, unspecified: Secondary | ICD-10-CM

## 2019-04-04 DIAGNOSIS — G4733 Obstructive sleep apnea (adult) (pediatric): Secondary | ICD-10-CM | POA: Diagnosis not present

## 2019-04-04 LAB — PULMONARY FUNCTION TEST
DL/VA % pred: 126 %
DL/VA: 5.1 ml/min/mmHg/L
DLCO unc % pred: 127 %
DLCO unc: 31.37 ml/min/mmHg
FEF 25-75 Post: 3.98 L/sec
FEF 25-75 Pre: 3.89 L/sec
FEF2575-%Change-Post: 2 %
FEF2575-%Pred-Post: 178 %
FEF2575-%Pred-Pre: 174 %
FEV1-%Change-Post: 0 %
FEV1-%Pred-Post: 112 %
FEV1-%Pred-Pre: 110 %
FEV1-Post: 3.38 L
FEV1-Pre: 3.35 L
FEV1FVC-%Change-Post: 2 %
FEV1FVC-%Pred-Pre: 113 %
FEV6-%Change-Post: -1 %
FEV6-%Pred-Post: 102 %
FEV6-%Pred-Pre: 103 %
FEV6-Post: 3.97 L
FEV6-Pre: 4.04 L
FEV6FVC-%Pred-Post: 106 %
FEV6FVC-%Pred-Pre: 106 %
FVC-%Change-Post: -1 %
FVC-%Pred-Post: 95 %
FVC-%Pred-Pre: 97 %
FVC-Post: 3.97 L
FVC-Pre: 4.04 L
Post FEV1/FVC ratio: 85 %
Post FEV6/FVC ratio: 100 %
Pre FEV1/FVC ratio: 83 %
Pre FEV6/FVC Ratio: 100 %
RV % pred: 112 %
RV: 2.77 L
TLC % pred: 101 %
TLC: 6.96 L

## 2019-04-04 MED ORDER — BUDESONIDE-FORMOTEROL FUMARATE 160-4.5 MCG/ACT IN AERO: 2.0000 | INHALATION_SPRAY | Freq: Two times a day (BID) | RESPIRATORY_TRACT | 0 refills | Status: DC

## 2019-04-04 NOTE — Patient Instructions (Addendum)
Walk today   Continue Symbicort 160 >>> 2 puffs in the morning right when you wake up, rinse out your mouth after use, 12 hours later 2 puffs, rinse after use >>> Take this daily, no matter what >>> This is not a rescue inhaler   Continue Nasal Sprays   Please start taking a daily antihistamine:  >>>choose one of: zyrtec, claritin, allegra, or xyzal  >>>these are over the counter medications  >>>can choose generic option  >>>take daily  >>>this medication helps with allergies, post nasal drip, and cough    Increase physical activity   Return in about 3 months (around 07/05/2019), or if symptoms worsen or fail to improve, for Follow up with Dr. Lamonte Sakai.   Coronavirus (COVID-19) Are you at risk?  Are you at risk for the Coronavirus (COVID-19)?  To be considered HIGH RISK for Coronavirus (COVID-19), you have to meet the following criteria:  . Traveled to [place], [place], [place], [place] or [place]; or in the [place] to [place], [place], [place], or [place]; and have fever, cough, and shortness of breath within the last 2 weeks of travel OR . Been in close contact with a person diagnosed with COVID-19 within the last 2 weeks and have fever, cough, and shortness of breath . IF YOU DO NOT MEET THESE CRITERIA, YOU ARE CONSIDERED LOW RISK FOR COVID-19.  What to do if you are HIGH RISK for COVID-19?  Marland Kitchen If you are having a medical emergency, call 911. . Seek medical care right away. Before you go to a doctor's office, urgent care or emergency department, call ahead and tell them about your recent travel, contact with someone diagnosed with COVID-19, and your symptoms. You should receive instructions from your physician's office regarding next steps of care.  . When you arrive at healthcare provider, tell the healthcare staff immediately you have returned from visiting [place], [place], [place], [place] or [place]; or traveled in the [place] to [place], [place], [place], or [place]; in the last two weeks or you have been in close contact with a person diagnosed with COVID-19 in the last 2 weeks.   . Tell the health care staff about your symptoms: fever, cough and shortness of breath. . After you have been seen by a medical provider, you will be either: o Tested for (COVID-19) and discharged home on quarantine except to seek medical care if symptoms worsen, and asked to  - Stay home and avoid contact with others until you get your results (4-5 days)  - Avoid travel on public transportation if possible (such as bus, train, or airplane) or o Sent to the Emergency Department by EMS for evaluation, COVID-19 testing, and possible admission depending on your condition and test results.  What to do if you are LOW RISK for COVID-19?  Reduce your risk of any infection by using the same precautions used for avoiding the common cold or flu:  Marland Kitchen Wash your hands often with soap and warm water for at least 20 seconds.  If soap and water are not readily available, use an alcohol-based hand sanitizer with at least 60% alcohol.  . If coughing or sneezing, cover your mouth and nose by coughing or sneezing into the elbow areas of your shirt or coat, into a tissue or into your sleeve (not your hands). . Avoid shaking hands with others and consider head nods or verbal greetings only. . Avoid touching your eyes, nose, or mouth with unwashed  hands.  . Avoid close contact with people who are sick. . Avoid places or events with large numbers of people in one location, like concerts or sporting events. . Carefully consider travel plans you have or are making. . If you are planning any travel outside or inside the [place], visit the CDC's Travelers' Health webpage for the latest health notices. . If you have some symptoms but not all symptoms, continue to monitor at home and seek medical attention if your symptoms worsen. . If you are having a medical emergency, call  911.   [place] / e-Visit: [email or web address]         [facility]: [facility]: [phone]                   [facility]: [phone]           It is flu season:   >>> Best ways to protect herself from the flu: Receive the yearly flu vaccine, practice good hand hygiene washing with soap and also using hand sanitizer when available, eat a nutritious meals, get adequate rest, hydrate appropriately   Please contact the office if your symptoms worsen or you have concerns that you are not improving.   Thank you for choosing [facility] for your healthcare, and for allowing [place] to partner with you on your healthcare journey. I am thankful to be able to provide care to you today.   Wyn Quaker FNP-C

## 2019-04-04 NOTE — Assessment & Plan Note (Addendum)
Assessment: Relatively normal pulmonary function testing today, patient did use Symbicort 160 prior Patient reports that he did not tolerate the Stiolto Respimat well, prefers Symbicort 160 Former smoker, 22-pack-year smoking history Patient reports he does not use his rescue inhaler, wife reports the patient gets nauseous from using rescue inhaler No CT chest imaging Breath sounds clear and exam today Patient was able to tolerate walk in office  Plan: Continue Symbicort 160 Okay to stop Stiolto Respimat Continue rescue inhaler as tolerated If shortness of breath worsens please present to our office Work on increasing your daily physical activity Continue daily antihistamine Continue nasal sprays If patient has another flare consider CBC with differential as well as IgE 32-month follow-up with Dr. Lamonte Sakai

## 2019-04-04 NOTE — Progress Notes (Signed)
Full PFT performed today. °

## 2019-04-04 NOTE — Progress Notes (Signed)
@Patient  ID: Vincent Richmond, male    DOB: [birth year], 73 y.o.   MRN: [id number]  Chief Complaint  Patient presents with  . Follow-up    Referring provider: [name]  HPI:  73 year old male former smoker followed in our office for suspected COPD  PMH: PTSD, hypertension, type 2 diabetes Smoker/ Smoking History: Former Smoker. 22 pack year smoker.  Maintenance:  Stiolto Respimat Pt of: Dr. Lamonte Sakai   04/04/2019  - Visit   73 year old male former smoker presenting for office today after completing pulmonary function test.  Pulmonary function test results are listed below:  04/04/2019-pulmonary function testing-FVC 4.04 (97% predicted), postbronchodilator ratio 85, postbronchodilator FEV1 3.38 (112%), DLCO 127 >>>Patient did use Symbicort 160 this morning  Patient reporting that he was switched to Stiolto Respimat and Symbicort 160 was stopped.  Patient is unsure when he switch back to Symbicort 160 and he is not sure why.  Will have to clarify with patient spouse over the phone if she cannot come to the appointment due to COVID-19 restrictions.  Patient's care is managed by the [facility].  Patient reports that he does not use her rescue inhaler.  But he uses the Symbicort 162 puffs twice daily.  Patient does not believe that he is on a daily allergy medication.  Patient does use nasal saline spray.  Patient states he is not very active.  He reports that if he has to walk around 100 yards on level ground he gets short of breath.  He reports that walk to the mailbox he typically has to take a break in between.  [facility] - Breathlessness Score 3 - I stop for breath after walking about 100 yards or after a few minutes on level ground (isle at grocery store is 156ft)    Tests:   04/04/2019-pulmonary function testing-FVC 4.04 (97% predicted), postbronchodilator ratio 85, postbronchodilator FEV1 3.38 (112%), DLCO 127 >>>Patient did use Symbicort 160 this morning  10/30/2018-  eosinophils relative 1, eosinophils absolute 0.2   SIX MIN WALK 04/04/2019  Supplimental Oxygen during Test? (L/min) No  Tech Comments: patient walked 2 laps, no desats, tolerated well unable to do 3rd lap due to leg pain. no lightheadedness or dizziness during walk     FENO:  No results found for: NITRICOXIDE  PFT: PFT Results Latest Ref Rng & Units 04/04/2019  FVC-Pre L 4.04  FVC-Predicted Pre % 97  FVC-Post L 3.97  FVC-Predicted Post % 95  Pre FEV1/FVC % % 83  Post FEV1/FCV % % 85  FEV1-Pre L 3.35  FEV1-Predicted Pre % 110  FEV1-Post L 3.38  DLCO UNC% % 127  DLCO COR %Predicted % 126  TLC L 6.96  TLC % Predicted % 101  RV % Predicted % 112    Imaging: No results found.    Specialty Problems      Pulmonary Problems   Acute respiratory failure (HCC)   Acute respiratory failure with hypoxia (HCC)   Bronchitis with bronchospasm   Acute bronchitis   Asthma-COPD overlap syndrome (HCC)    04/04/2019-pulmonary function testing-FVC 4.04 (97% predicted), postbronchodilator ratio 85, postbronchodilator FEV1 3.38 (112%), DLCO 127 >>>Patient did use Symbicort 160 this morning  Patient believes he has better breathing and benefit from ICS Laba versus Lama Laba      OSA (obstructive sleep apnea)      Allergies  Allergen Reactions  . Statins Other (See Comments)    Severe muscle pains     There is no immunization history on  file for this patient.  Past Medical History:  Diagnosis Date  . Diabetes mellitus without complication (HCC)   . Hypertension   . PTSD (post-traumatic stress disorder)   . Stroke [facility](HCC)     Tobacco History: Social History   Tobacco Use  Smoking Status Former Smoker  . Packs/day: 1.00  . Years: 22.00  . Pack years: 22.00  . Types: Cigarettes  . Start date: 11955  . Quit date: 10/07/1975  . Years since quitting: 43.5  Smokeless Tobacco Never Used   Counseling given: Yes  Continue to not smoke  Outpatient Encounter Medications as  of 04/04/2019  Medication Sig  . Ascorbic Acid (VITAMIN C PO) Take 100 mg by mouth daily.  Marland Kitchen. aspirin EC 325 MG tablet Take 325 mg by mouth daily.  Marland Kitchen. atenolol (TENORMIN) 50 MG tablet Take 50 mg by mouth 2 (two) times daily.  . budesonide-formoterol (SYMBICORT) 160-4.5 MCG/ACT inhaler Inhale 2 puffs into the lungs 2 (two) times daily.  Marland Kitchen. buPROPion (WELLBUTRIN XL) 300 MG 24 hr tablet Take 300 mg by mouth daily.  . cholecalciferol (VITAMIN D) 1000 UNITS tablet Take 1,000 Units by mouth daily.  Marland Kitchen. FLUoxetine (PROZAC) 20 MG capsule Take 80 mg by mouth daily. For depression and anxiety -4 tabs in am  . fluticasone (FLONASE) 50 MCG/ACT nasal spray Place 2 sprays into both nostrils daily.  Marland Kitchen. glipiZIDE (GLUCOTROL) 5 MG tablet Take 5 mg by mouth 2 (two) times daily.   Marland Kitchen. HYDROcodone-acetaminophen (NORCO/VICODIN) 5-325 MG per tablet Take 2 tablets by mouth every 6 (six) hours as needed.  . insulin glargine (LANTUS) 100 unit/mL SOPN Inject 75 Units into the skin daily.   Marland Kitchen. loperamide (IMODIUM) 2 MG capsule Take 4 mg by mouth daily.   Marland Kitchen. loratadine (CLARITIN) 10 MG tablet Take 1 tablet (10 mg total) by mouth daily.  . metFORMIN (GLUCOPHAGE) 1000 MG tablet Take 1,000 mg by mouth 2 (two) times daily with a meal.  . modafinil (PROVIGIL) 100 MG tablet Take 300 mg by mouth daily.  . NON FORMULARY CPAP Machine daily  . Omega-3 Fatty Acids (FISH OIL) 1000 MG CAPS Take 1,000 mg by mouth daily.   Marland Kitchen. omeprazole (PRILOSEC) 20 MG capsule Take 20 mg by mouth daily.  . prazosin (MINIPRESS) 5 MG capsule Take 15 mg by mouth at bedtime. For nightmares and flashbacks   . pregabalin (LYRICA) 300 MG capsule Take 300 mg by mouth 2 (two) times daily.  Marland Kitchen. albuterol (PROVENTIL HFA;VENTOLIN HFA) 108 (90 Base) MCG/ACT inhaler Inhale 2 puffs into the lungs every 6 (six) hours as needed for wheezing or shortness of breath. (Patient not taking: Reported on 04/04/2019)  . benzonatate (TESSALON) 200 MG capsule Take 1 capsule (200 mg total) by  mouth 3 (three) times daily as needed for cough. (Patient not taking: Reported on 04/04/2019)  . budesonide-formoterol (SYMBICORT) 160-4.5 MCG/ACT inhaler Inhale 2 puffs into the lungs 2 (two) times daily.  . metoprolol tartrate (LOPRESSOR) 25 MG tablet Take 1 tablet (25 mg total) by mouth 2 (two) times daily. (Patient not taking: Reported on 04/04/2019)  . Tiotropium Bromide-Olodaterol (STIOLTO RESPIMAT) 2.5-2.5 MCG/ACT AERS Inhale 2 puffs into the lungs daily. (Patient not taking: Reported on 04/04/2019)   No facility-administered encounter medications on file as of 04/04/2019.      Review of Systems  Review of Systems  Constitutional: Positive for fatigue. Negative for activity change, chills, fever and unexpected weight change.  HENT: Negative for postnasal drip, rhinorrhea, sneezing and sore throat.  Eyes: Negative.   Respiratory: Positive for shortness of breath. Negative for cough and wheezing.   Cardiovascular: Negative for chest pain and palpitations.  Gastrointestinal: Negative for constipation, diarrhea, nausea and vomiting.  Endocrine: Negative.   Musculoskeletal: Negative.   Skin: Negative.   Neurological: Negative for dizziness and headaches.  Psychiatric/Behavioral: Negative.  Negative for dysphoric mood. The patient is not nervous/anxious.   All other systems reviewed and are negative.    Physical Exam  BP (!) 124/58 (BP Location: Left Arm, Cuff Size: Normal)   Pulse 71   Temp 98.1 F (36.7 C) (Oral)   Ht 5\' 9"  (1.753 m)   Wt 272 lb 3.2 oz (123.5 kg)   SpO2 95%   BMI 40.20 kg/m   Wt Readings from Last 5 Encounters:  04/04/19 272 lb 3.2 oz (123.5 kg)  12/20/18 273 lb (123.8 kg)  10/30/18 270 lb (122.5 kg)  02/17/15 266 lb (120.7 kg)     Physical Exam  Constitutional: He is oriented to person, place, and time and well-developed, well-nourished, and in no distress. No distress.  HENT:  Head: Normocephalic and atraumatic.  Right Ear: Hearing, tympanic  membrane, external ear and ear canal normal.  Left Ear: Hearing, tympanic membrane, external ear and ear canal normal.  Nose: Nose normal. Right sinus exhibits no maxillary sinus tenderness and no frontal sinus tenderness. Left sinus exhibits no maxillary sinus tenderness and no frontal sinus tenderness.  Mouth/Throat: Uvula is midline and oropharynx is clear and moist. No oropharyngeal exudate.  Eyes: Pupils are equal, round, and reactive to light.  Neck: Normal range of motion. Neck supple.  Cardiovascular: Normal rate, regular rhythm and normal heart sounds.  Pulmonary/Chest: Effort normal and breath sounds normal. No accessory muscle usage. No respiratory distress. He has no decreased breath sounds. He has no wheezes. He has no rhonchi. He has no rales.  Abdominal: Soft. Bowel sounds are normal. He exhibits no distension. There is no abdominal tenderness.  Truncal obesity  Musculoskeletal: Normal range of motion.        General: No edema.  Lymphadenopathy:    He has no cervical adenopathy.  Neurological: He is alert and oriented to person, place, and time.  Tolerated walk in office  Skin: Skin is warm and dry. He is not diaphoretic. No erythema.  Psychiatric: Mood, memory, affect and judgment normal.  Nursing note and vitals reviewed.     Lab Results:  CBC    Component Value Date/Time   WBC 21.1 (H) 11/01/2018 0435   RBC 4.50 11/01/2018 0435   HGB 12.9 (L) 11/01/2018 0435   HCT 39.1 11/01/2018 0435   PLT 268 11/01/2018 0435   MCV 86.9 11/01/2018 0435   MCH 28.7 11/01/2018 0435   MCHC 33.0 11/01/2018 0435   RDW 14.0 11/01/2018 0435   LYMPHSABS 1.5 10/30/2018 1844   MONOABS 0.4 10/30/2018 1844   EOSABS 0.2 10/30/2018 1844   BASOSABS 0.1 10/30/2018 1844    BMET    Component Value Date/Time   NA 136 11/01/2018 0435   K 4.8 11/01/2018 0435   CL 101 11/01/2018 0435   CO2 22 11/01/2018 0435   GLUCOSE 314 (H) 11/01/2018 0435   BUN 22 11/01/2018 0435   CREATININE 0.96  11/01/2018 0435   CALCIUM 8.4 (L) 11/01/2018 0435   GFRNONAA >60 11/01/2018 0435   GFRAA >60 11/01/2018 0435    BNP    Component Value Date/Time   BNP 47.5 10/30/2018 1844    ProBNP No results found  for: PROBNP    Assessment & Plan:   OSA (obstructive sleep apnea) Plan: [facility], continue [facility]  Asthma-COPD overlap syndrome (HCC) Assessment: Relatively normal pulmonary function testing today, patient did use Symbicort 160 prior Patient reports that he did not tolerate the Stiolto Respimat well, prefers Symbicort 160 Former smoker, 22-pack-year smoking history Patient reports he does not use his rescue inhaler, wife reports the patient gets nauseous from using rescue inhaler No CT chest imaging Breath sounds clear and exam today Patient was able to tolerate walk in office  Plan: Continue Symbicort 160 Okay to stop Stiolto Respimat Continue rescue inhaler as tolerated If shortness of breath worsens please present to our office Work on increasing your daily physical activity Continue daily antihistamine Continue nasal sprays If patient has another flare consider CBC with differential as well as IgE 642-month follow-up with Dr. Delton CoombesByrum    Return in about 3 months (around 07/05/2019), or if symptoms worsen or fail to improve, for Follow up with Dr. Delton CoombesByrum.   Coral CeoBrian P Mack, NP 04/04/2019   This appointment was 30 minutes long with over 50% of the time in direct face-to-face patient care, assessment, plan of care, and follow-up.

## 2019-04-04 NOTE — Assessment & Plan Note (Signed)
Plan: On CPAP managed by the [facility], continue therapy

## 2019-04-11 ENCOUNTER — Ambulatory Visit: Payer: Medicare HMO | Admitting: Pulmonary Disease

## 2019-07-05 ENCOUNTER — Telehealth: Payer: Self-pay | Admitting: Emergency Medicine

## 2019-07-05 NOTE — Telephone Encounter (Signed)
Patrice can you help with this? Do you have this paperwork, I don't see any recent scanned authorization forms. Please advise.

## 2019-07-06 ENCOUNTER — Ambulatory Visit: Payer: No Typology Code available for payment source | Admitting: Pulmonary Disease

## 2019-07-07 NOTE — Telephone Encounter (Signed)
Last scanned authorization was 2/28-8/26/20.  Patient will need new authorization dates, Sharl Ma please advise

## 2019-07-12 NOTE — Telephone Encounter (Signed)
Faxed request for VA authorization form and last office notes to the [place] - we will have to wait for faxed authorization - which can take up to 30 days or more. I will hold onto the paperwork faxed and check periodically on status -pr

## 2019-07-18 NOTE — Telephone Encounter (Signed)
Continue to wait for VA authorization.

## 2019-07-22 NOTE — Telephone Encounter (Signed)
Received VA authorization. Referral is good 07/15/2019 until 01/11/2020. Will hold onto paperwork since he has a follow up with Aaron Edelman on 07/25/19 at 1030.

## 2019-07-24 NOTE — Progress Notes (Signed)
@Patient  ID: Vincent Richmond, male    DOB: 11-Dec-1945, 73 y.o.   MRN: [id number]  Chief Complaint  Patient presents with  . Follow-up    3 MONTH f/u for COPD. Increased productive cough with greenish, brownish phlegm. Increased SOB.     Referring provider: [name]  HPI:  73 year old male former smoker followed in our office for asthma COPD overlap  PMH: PTSD, hypertension, type 2 diabetes Smoker/ Smoking History: Former Smoker.  Quit 1977.  21 pack year smoker.  Maintenance:  Symbicort 160 Pt of: Dr. Lamonte Sakai   07/25/2019  - Visit   73 year old male former smoker followed in our office for asthma COPD overlap syndrome.  Patient was last seen in our office 3 months ago after completing pulmonary function testing.  Patient at that time requested to go back to Symbicort 160 which he felt helped him more than the Stiolto Respimat he was trialed on.  Patient received Symbicort 160 from the [place] in [place] at no cost to him.  Overall patient feels that his symptoms are well controlled.  [facility] chronic [facility]-like symptoms.  Patient reports this is been better managed since switching to Flonase and stopping antihistamines.  Patient also has a CPAP that is managed through the [place].  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  Tests:   04/04/2019-pulmonary function testing-FVC 4.04 (97% predicted), postbronchodilator ratio 85, postbronchodilator FEV1 3.38 (112%), DLCO 127 >>>Patient did use Symbicort 160 this morning  10/30/2018- eosinophils relative 1, eosinophils absolute 0.2  FENO:  No results found for: NITRICOXIDE  PFT: PFT Results Latest Ref Rng & Units 04/04/2019  FVC-Pre L 4.04  FVC-Predicted Pre % 97  FVC-Post L 3.97  FVC-Predicted Post % 95  Pre FEV1/FVC % % 83  Post FEV1/FCV % % 85  FEV1-Pre L 3.35  FEV1-Predicted Pre % 110  FEV1-Post L 3.38  DLCO UNC% % 127  DLCO COR %Predicted % 126  TLC L 6.96  TLC  % Predicted % 101  RV % Predicted % 112    WALK:  SIX MIN WALK 04/04/2019  Supplimental Oxygen during Test? (L/min) No  Tech Comments: patient walked 2 laps, no desats, tolerated well unable to do 3rd lap due to leg pain. no lightheadedness or dizziness during walk    Imaging: No results found.  Lab Results:  CBC    Component Value Date/Time   WBC 21.1 (H) 11/01/2018 0435   RBC 4.50 11/01/2018 0435   HGB 12.9 (L) 11/01/2018 0435   HCT 39.1 11/01/2018 0435   PLT 268 11/01/2018 0435   MCV 86.9 11/01/2018 0435   MCH 28.7 11/01/2018 0435   MCHC 33.0 11/01/2018 0435   RDW 14.0 11/01/2018 0435   LYMPHSABS 1.5 10/30/2018 1844   MONOABS 0.4 10/30/2018 1844   EOSABS 0.2 10/30/2018 1844   BASOSABS 0.1 10/30/2018 1844    BMET    Component Value Date/Time   NA 136 11/01/2018 0435   K 4.8 11/01/2018 0435   CL 101 11/01/2018 0435   CO2 22 11/01/2018 0435   GLUCOSE 314 (H) 11/01/2018 0435   BUN 22 11/01/2018 0435   CREATININE 0.96 11/01/2018 0435   CALCIUM 8.4 (L) 11/01/2018 0435   GFRNONAA >60 11/01/2018 0435   GFRAA >60 11/01/2018 0435    BNP    Component Value Date/Time   BNP 47.5 10/30/2018 1844    ProBNP No results found for: PROBNP  Specialty Problems  Pulmonary Problems   Acute respiratory failure (HCC)   Acute respiratory failure with hypoxia (HCC)   Bronchitis with bronchospasm   Acute bronchitis   Asthma-COPD overlap syndrome (HCC)    04/04/2019-pulmonary function testing-FVC 4.04 (97% predicted), postbronchodilator ratio 85, postbronchodilator FEV1 3.38 (112%), DLCO 127 >>>Patient did use Symbicort 160 this morning  Patient believes he has better breathing and benefit from ICS Laba versus Lama Laba      OSA (obstructive sleep apnea)   Allergic rhinitis      Allergies  Allergen Reactions  . Statins Other (See Comments)    Severe muscle pains     There is no immunization history on file for this patient.  [facility] reports [facility]  Past Medical History:  Diagnosis Date  . Diabetes mellitus without complication (HCC)   . Hypertension   . PTSD (post-traumatic stress disorder)   . Stroke [facility])     Tobacco History: Social History   Tobacco Use  Smoking Status Former Smoker  . Packs/day: 1.00  . Years: 21.00  . Pack years: 21.00  . Types: Cigarettes  . Start date: 7  . Quit date: 10/07/1975  . Years since quitting: 43.8  Smokeless Tobacco Never Used   Counseling given: Yes   Continue to not smoke  Outpatient Encounter Medications as of 07/25/2019  Medication Sig  . Ascorbic Acid (VITAMIN C PO) Take 100 mg by mouth daily.  Marland Kitchen aspirin EC 325 MG tablet Take 325 mg by mouth daily.  Marland Kitchen atenolol (TENORMIN) 50 MG tablet Take 50 mg by mouth 2 (two) times daily.  . budesonide-formoterol (SYMBICORT) 160-4.5 MCG/ACT inhaler Inhale 2 puffs into the lungs 2 (two) times daily.  Marland Kitchen buPROPion (WELLBUTRIN XL) 300 MG 24 hr tablet Take 300 mg by mouth daily.  . cholecalciferol (VITAMIN D) 1000 UNITS tablet Take 1,000 Units by mouth daily.  Marland Kitchen FLUoxetine (PROZAC) 20 MG capsule Take 80 mg by mouth daily. For depression and anxiety -4 tabs in am  . fluticasone (FLONASE) 50 MCG/ACT nasal spray Place 2 sprays into both nostrils daily.  Marland Kitchen glipiZIDE (GLUCOTROL) 5 MG tablet Take 5 mg by mouth 2 (two) times daily.   Marland Kitchen HYDROcodone-acetaminophen (NORCO/VICODIN) 5-325 MG per tablet Take 2 tablets by mouth every 6 (six) hours as needed.  . insulin glargine (LANTUS) 100 unit/mL SOPN Inject 75 Units into the skin daily.   Marland Kitchen loperamide (IMODIUM) 2 MG capsule Take 4 mg by mouth daily.   . metFORMIN (GLUCOPHAGE) 1000 MG tablet Take 1,000 mg by mouth 2 (two) times daily with a meal.  . metoprolol tartrate (LOPRESSOR) 25 MG tablet Take 1 tablet (25 mg total) by mouth 2 (two) times daily.  . modafinil (PROVIGIL) 100 MG tablet Take 300 mg by mouth daily.  . NON FORMULARY CPAP Machine daily  . Omega-3 Fatty Acids (FISH OIL) 1000  MG CAPS Take 1,000 mg by mouth daily.   Marland Kitchen omeprazole (PRILOSEC) 20 MG capsule Take 20 mg by mouth daily.  . prazosin (MINIPRESS) 5 MG capsule Take 15 mg by mouth at bedtime. For nightmares and flashbacks   . pregabalin (LYRICA) 300 MG capsule Take 300 mg by mouth 2 (two) times daily.  . [DISCONTINUED] albuterol (PROVENTIL HFA;VENTOLIN HFA) 108 (90 Base) MCG/ACT inhaler Inhale 2 puffs into the lungs every 6 (six) hours as needed for wheezing or shortness of breath. (Patient not taking: Reported on 04/04/2019)  . [DISCONTINUED] benzonatate (TESSALON) 200 MG capsule Take 1 capsule (200  mg total) by mouth 3 (three) times daily as needed for cough. (Patient not taking: Reported on 04/04/2019)  . [DISCONTINUED] budesonide-formoterol (SYMBICORT) 160-4.5 MCG/ACT inhaler Inhale 2 puffs into the lungs 2 (two) times daily.  . [DISCONTINUED] loratadine (CLARITIN) 10 MG tablet Take 1 tablet (10 mg total) by mouth daily.  . [DISCONTINUED] Tiotropium Bromide-Olodaterol (STIOLTO RESPIMAT) 2.5-2.5 MCG/ACT AERS Inhale 2 puffs into the lungs daily. (Patient not taking: Reported on 04/04/2019)   No facility-administered encounter medications on file as of 07/25/2019.      Review of Systems  Review of Systems  Constitutional: Negative for activity change, chills, fatigue, fever and unexpected weight change.  HENT: Positive for congestion, postnasal drip and rhinorrhea. Negative for sinus pressure, sinus pain and sore throat.   Eyes: Negative.   Respiratory: Negative for cough, shortness of breath and wheezing.   Cardiovascular: Negative for chest pain and palpitations.  Gastrointestinal: Negative for constipation, diarrhea, nausea and vomiting.  Endocrine: Negative.   Genitourinary: Negative.   Musculoskeletal: Negative.   Skin: Negative.   Neurological: Negative for dizziness and headaches.  Psychiatric/Behavioral: Negative.  Negative for dysphoric mood. The patient is not nervous/anxious.   All other systems  reviewed and are negative.    Physical Exam  BP 120/78 (BP Location: Left Arm, Patient Position: Sitting, Cuff Size: Large)   Pulse 69   Temp (!) 97.2 F (36.2 C) (Temporal)   Ht  (1.753 m)   Wt 269 lb 6.4 oz (122.2 kg)   SpO2 96%   BMI 39.78 kg/m   Wt Readings from Last 5 Encounters:  07/25/19 269 lb 6.4 oz (122.2 kg)  04/04/19 272 lb 3.2 oz (123.5 kg)  12/20/18 273 lb (123.8 kg)  10/30/18 270 lb (122.5 kg)  02/17/15 266 lb (120.7 kg)    BMI Readings from Last 5 Encounters:  07/25/19 39.78 kg/m  04/04/19 40.20 kg/m  12/20/18 38.08 kg/m  10/30/18 37.66 kg/m  02/17/15 37.10 kg/m     Physical Exam Vitals signs and nursing note reviewed.  Constitutional:      General: He is not in acute distress.    Appearance: Normal appearance. He is obese.  HENT:     Head: Normocephalic and atraumatic.     Right Ear: Hearing and external ear normal.     Left Ear: Hearing and external ear normal.     Nose: No mucosal edema.     Right Turbinates: Not enlarged.     Left Turbinates: Not enlarged.  Eyes:     Pupils: Pupils are equal, round, and reactive to light.  Neck:     Musculoskeletal: Normal range of motion.  Cardiovascular:     Rate and Rhythm: Normal rate and regular rhythm.     Pulses: Normal pulses.     Heart sounds: Normal heart sounds. No murmur.  Pulmonary:     Effort: Pulmonary effort is normal.     Breath sounds: Normal breath sounds. No decreased breath sounds, wheezing or rales.  Lymphadenopathy:     Cervical: No cervical adenopathy.  Skin:    General: Skin is warm and dry.     Capillary Refill: Capillary refill takes less than 2 seconds.     Findings: No erythema or rash.  Neurological:     General: No focal deficit present.     Mental Status: He is alert and oriented to person, place, and time.     Motor: No weakness.     Coordination: Coordination normal.  Gait: Gait is intact. Gait normal.  Psychiatric:        Mood and Affect: Mood  normal.        Behavior: Behavior normal. Behavior is cooperative.        Thought Content: Thought content normal.        Judgment: Judgment normal.       Assessment & Plan:   Allergic rhinitis Plan: Continue [facility] Lab work today  With patient's chronic rhinitis-like history may benefit from biologic medications  Asthma-COPD overlap syndrome (HCC) Plan: Continue Symbicort 160 Lab work today Follow-up with our office in 4 months Continue nasal spray as [facility] by the [facility]  OSA (obstructive sleep apnea) Plan: Continue CPAP therapy [facility]    Return in about 4 months (around 11/25/2019), or if symptoms worsen or fail to improve, for Follow up with Dr. Delton CoombesByrum.   Coral CeoBrian P Laird Runnion, NP 07/25/2019   This appointment was 28 minutes long with over 50% of the time in direct face-to-face patient care, assessment, plan of care, and follow-up.

## 2019-07-25 ENCOUNTER — Ambulatory Visit (INDEPENDENT_AMBULATORY_CARE_PROVIDER_SITE_OTHER): Payer: No Typology Code available for payment source | Admitting: Pulmonary Disease

## 2019-07-25 ENCOUNTER — Encounter: Payer: Self-pay | Admitting: Pulmonary Disease

## 2019-07-25 ENCOUNTER — Other Ambulatory Visit: Payer: Self-pay

## 2019-07-25 VITALS — BP 120/78 | HR 69 | Temp 97.2°F | Ht 69.0 in | Wt 269.4 lb

## 2019-07-25 DIAGNOSIS — G4733 Obstructive sleep apnea (adult) (pediatric): Secondary | ICD-10-CM

## 2019-07-25 DIAGNOSIS — J449 Chronic obstructive pulmonary disease, unspecified: Secondary | ICD-10-CM | POA: Diagnosis not present

## 2019-07-25 DIAGNOSIS — J309 Allergic rhinitis, unspecified: Secondary | ICD-10-CM | POA: Diagnosis not present

## 2019-07-25 LAB — CBC WITH DIFFERENTIAL/PLATELET
Basophils Absolute: 0.1 10*3/uL (ref 0.0–0.1)
Basophils Relative: 1.4 % (ref 0.0–3.0)
Eosinophils Absolute: 0.3 10*3/uL (ref 0.0–0.7)
Eosinophils Relative: 3.3 % (ref 0.0–5.0)
HCT: 39.7 % (ref 39.0–52.0)
Hemoglobin: 13.2 g/dL (ref 13.0–17.0)
Lymphocytes Relative: 17.3 % (ref 12.0–46.0)
Lymphs Abs: 1.7 10*3/uL (ref 0.7–4.0)
MCHC: 33.2 g/dL (ref 30.0–36.0)
MCV: 86.2 fl (ref 78.0–100.0)
Monocytes Absolute: 0.5 10*3/uL (ref 0.1–1.0)
Monocytes Relative: 5.6 % (ref 3.0–12.0)
Neutro Abs: 7 10*3/uL (ref 1.4–7.7)
Neutrophils Relative %: 72.4 % (ref 43.0–77.0)
Platelets: 287 10*3/uL (ref 150.0–400.0)
RBC: 4.61 Mil/uL (ref 4.22–5.81)
RDW: 14.2 % (ref 11.5–15.5)
WBC: 9.6 10*3/uL (ref 4.0–10.5)

## 2019-07-25 NOTE — Assessment & Plan Note (Signed)
Plan: Continue [facility] 

## 2019-07-25 NOTE — Addendum Note (Signed)
Addended by: Suzzanne Cloud E on: 07/25/2019 11:08 AM   Modules accepted: Orders

## 2019-07-25 NOTE — Assessment & Plan Note (Signed)
Plan: Continue Flonase as managed by the [facility] Lab work today  With patient's chronic rhinitis-like history may benefit from biologic medications

## 2019-07-25 NOTE — Assessment & Plan Note (Signed)
Plan: Continue Symbicort 160 Lab work today Follow-up with our office in 4 months Continue nasal spray as managed by the [place]

## 2019-07-25 NOTE — Patient Instructions (Addendum)
You were seen today by Lauraine Rinne, NP  for:   1. Asthma-COPD overlap syndrome (HCC)  - CBC with Differential/Platelet; Future - IgE; Future  Continue Symbicort 160 >>> 2 puffs in the morning right when you wake up, rinse out your mouth after use, 12 hours later 2 puffs, rinse after use >>> Take this daily, no matter what >>> This is not a rescue inhaler   Please notify our office if you have worsened symptoms of congestion, cough, wheezing, nasal drainage or sinus-like infections   2. Allergic rhinitis, unspecified seasonality, unspecified trigger  [facility]  3. OSA (obstructive sleep apnea)  [facility]   We recommend today:  Orders Placed This Encounter  Procedures  . CBC with Differential/Platelet    Standing Status:   Future    Standing Expiration Date:   07/24/2020  . IgE    Standing Status:   Future    Standing Expiration Date:   07/24/2020   Orders Placed This Encounter  Procedures  . CBC with Differential/Platelet  . IgE   No orders of the defined types were placed in this encounter.   Follow Up:    Return in about 4 months (around 11/25/2019), or if symptoms worsen or fail to improve, for Follow up with Dr. Lamonte Sakai.   Please do your part to reduce the spread of COVID-19:      Reduce your risk of any infection  and COVID19 by using the similar precautions used for avoiding the common cold or flu:  Marland Kitchen Wash your hands often with soap and warm water for at least 20 seconds.  If soap and water are not readily available, use an alcohol-based hand sanitizer with at least 60% alcohol.  . If coughing or sneezing, cover your mouth and nose by coughing or sneezing into the elbow areas of your shirt or coat, into a tissue or into your sleeve (not your hands). Langley Gauss A MASK when in public  . Avoid shaking hands with others and consider head nods or verbal greetings only. . Avoid touching your eyes, nose, or  mouth with unwashed hands.  . Avoid close contact with people who are sick. . Avoid places or events with large numbers of people in one location, like concerts or sporting events. . If you have some symptoms but not all symptoms, continue to monitor at home and seek medical attention if your symptoms worsen. . If you are having a medical emergency, call 911.   [place] / e-Visit: [email or web address]         [facility]: [facility]: [phone]                   [facility]: [phone]     It is flu season:   >>> Best ways to protect herself from the flu: Receive the yearly flu vaccine, practice good hand hygiene washing with soap and also using hand sanitizer when available, eat a nutritious meals, get adequate rest, hydrate appropriately   Please contact the office if your symptoms worsen or you have concerns that you are not improving.   Thank you for choosing [facility] for your healthcare, and for allowing [place] to partner with you on your healthcare journey. I am thankful to be able to provide care to you today.   Wyn Quaker FNP-C

## 2019-07-26 LAB — IGE: IgE (Immunoglobulin E), Serum: 197 kU/L — ABNORMAL HIGH (ref <=114)

## 2019-07-26 NOTE — Progress Notes (Signed)
Eosinophils as well as IgE both lab markers are elevated.  07/25/2019-IgE-197 07/25/2019-CBC with differential-eosinophils Relative 3.3, eosinophils absolute 0.3  This is what we suspected.  If patient starts to have worsening symptoms with breathing or chronic rhinitis could consider starting biologic medications to help with management of this.  Primary care at the [facility] could also consider referral to allergy given your chronic rhinitis-like symptoms as well as elevated blood work on these labs today.  No further changes at this time.  If symptoms worsen or if you require prednisone we could consider starting this medication.  Wyn Quaker, FNP

## 2019-07-27 NOTE — Progress Notes (Signed)
Pt and spouse notified of results and recommendations. Nothing further needed.

## 2019-08-02 ENCOUNTER — Telehealth: Payer: Self-pay | Admitting: Pulmonary Disease

## 2019-08-02 NOTE — Telephone Encounter (Signed)
Spoke with Glenard Haring at [facility], verified records request and fax #.  Last OV note has been faxed as requested.  Nothing further needed at this time- will close encounter.

## 2019-11-23 ENCOUNTER — Ambulatory Visit (INDEPENDENT_AMBULATORY_CARE_PROVIDER_SITE_OTHER): Payer: No Typology Code available for payment source | Admitting: Emergency Medicine

## 2019-11-23 ENCOUNTER — Encounter: Payer: Self-pay | Admitting: Emergency Medicine

## 2019-11-23 ENCOUNTER — Other Ambulatory Visit: Payer: Self-pay

## 2019-11-23 DIAGNOSIS — G4733 Obstructive sleep apnea (adult) (pediatric): Secondary | ICD-10-CM | POA: Diagnosis not present

## 2019-11-23 DIAGNOSIS — K219 Gastro-esophageal reflux disease without esophagitis: Secondary | ICD-10-CM | POA: Diagnosis not present

## 2019-11-23 DIAGNOSIS — J309 Allergic rhinitis, unspecified: Secondary | ICD-10-CM | POA: Diagnosis not present

## 2019-11-23 MED ORDER — ALBUTEROL SULFATE HFA 108 (90 BASE) MCG/ACT IN AERS: 2.0000 | INHALATION_SPRAY | Freq: Four times a day (QID) | RESPIRATORY_TRACT | 5 refills | Status: DC | PRN

## 2019-11-23 NOTE — Assessment & Plan Note (Signed)
Difficulty using his CPAP, at least in part due to nasal obstruction.  Also due to leak and the noise of the machine.  I encouraged him to wear as reliably as possible.  We will try to treat his allergies more aggressively.

## 2019-11-23 NOTE — Assessment & Plan Note (Signed)
Potential contributor to cough.  We will continue this once daily

## 2019-11-23 NOTE — Assessment & Plan Note (Signed)
Only using his Flonase as needed.  We will increase back to once daily every day

## 2019-11-23 NOTE — Patient Instructions (Signed)
We will try stopping her Symbicort for 2 weeks.  Keep track of how your breathing does, whether you develop any increase shortness of breath, wheezing, chest tightness.  If you do not develop new symptoms off the medication then stay off of it.  If you find that you miss the Symbicort it is okay to restart it.  Remember to rinse and gargle after use this medication. We will give you a prescription for albuterol.  Use this inhaler 2 puffs as needed if you develop shortness of breath, chest tightness, wheezing.  You do not have to take this medication on a schedule. Please restart your fluticasone nasal spray, 2 sprays each nostril once daily.  Take this every day. Continue your omeprazole 20 mg once daily.  Take this every day 1 hour around food. Continue to use her CPAP reliably every night. Follow with Dr. Delton Coombes in 12 months or sooner if you have any problems.

## 2019-11-23 NOTE — Assessment & Plan Note (Signed)
Mild obstruction based only on curve of his flow volume loop.  He has limited functional capacity and does have exertional dyspnea but I think this is primarily to deconditioning.  He is not sure if Symbicort is helping him.  We will do an experiment of stopping it for 2 weeks to see if he tolerates.  If he misses it he will restart.  Also he needs an albuterol inhaler to keep available for symptoms.  We will write a prescription for this today.

## 2019-11-23 NOTE — Progress Notes (Signed)
Subjective:    Patient ID: Vincent Richmond, male    DOB: [birth year], 74 y.o.   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  He is followed at the [place], is referred today for evaluation of presumed COPD, continued symptoms.  He was admitted to [facility] with an apparent acute exacerbation of COPD with associated hypoxemic respiratory failure in late January of this year. Even after d/c he continued to have cough, a bit better over the last week.  Currently managed on Symbicort + albuterol (new), Flonase prn, Claritin prn, omeprazole.  ROV 11/23/19 --74 year old gentleman with a history of COPD, allergic rhinitis with normal eosinophil count, IgE 197 (October 2020).  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  Currently managed on Symbicort, Flonase prn, omeprazole daily. He was recently treated for otitis, better but still has some intermittent B ear pain. He is unsure whether Symbicort helps him. His exertional tolerance is poor, but his activity level is low. He doesn't push himself. He doesn't have an albuterol inhaler. He is compliant with CPAP "in streaks" > some nights he has more difficulty. Wakes him up due to the noise, to leakage.   I reviewed his pulmonary function testing from June 2020. I reviewed his prior office visits from Wyn Quaker from 03/26/2019 and 07/26/2019 Medications adjusted as below   Review of Systems  Constitutional: Negative for activity change, appetite change, chills, diaphoresis, fatigue, fever and unexpected weight change.  HENT: Positive for postnasal drip, sore throat and trouble swallowing. Negative for congestion, dental problem, nosebleeds, rhinorrhea, sinus pressure, sneezing and voice change.   Eyes: Negative for itching and visual disturbance.  Respiratory: Positive for cough,  shortness of breath and wheezing. Negative for choking, chest tightness and stridor.   Cardiovascular: Positive for chest pain and palpitations. Negative for leg swelling.  Gastrointestinal: Negative for abdominal pain.  Musculoskeletal: Negative for joint swelling and myalgias.  Skin: Negative for rash.  Neurological: Positive for headaches. Negative for syncope and light-headedness.  Psychiatric/Behavioral: Positive for agitation and dysphoric mood. Negative for sleep disturbance.     Past Medical History:  Diagnosis Date  . Diabetes mellitus without complication ([place])   . Hypertension   . PTSD (post-traumatic stress disorder)   . Stroke [facility])      Family History  Problem Relation Age of Onset  . Heart disease Father   . Cancer Father      Social History   Socioeconomic History  . Marital status: Married    Spouse name: Not on file  . Number of children: Not on file  . Years of education: Not on file  . Highest education level: Not on file  Occupational History  . Not on file  Tobacco Use  . Smoking status: Former Smoker    Packs/day: 1.00    Years: 21.00    Pack years: 21.00    Types: Cigarettes    Start date: 14    Quit date: 10/07/1975    Years since quitting: 44.1  . Smokeless tobacco: Never Used  Substance and Sexual Activity  . Alcohol use: No  . Drug use: Never  . Sexual activity: Yes  Other Topics Concern  . Not on file  Social History Narrative  . Not on file   Social Determinants of Health   Financial Resource Strain:   .  Difficulty of Paying Living Expenses: Not on file  Food Insecurity:   . Worried About Programme researcher, broadcasting/film/video in the Last Year: Not on file  . Ran Out of Food in the Last Year: Not on file  Transportation Needs:   . Lack of Transportation (Medical): Not on file  . Lack of Transportation (Non-Medical): Not on file  Physical Activity:   . Days of Exercise per Week: Not on file  . Minutes of Exercise per Session: Not on file   Stress:   . Feeling of Stress : Not on file  Social Connections:   . Frequency of Communication with Friends and Family: Not on file  . Frequency of Social Gatherings with Friends and Family: Not on file  . Attends Religious Services: Not on file  . Active Member of Clubs or Organizations: Not on file  . Attends Banker Meetings: Not on file  . Marital Status: Not on file  Intimate Partner Violence:   . Fear of Current or Ex-Partner: Not on file  . Emotionally Abused: Not on file  . Physically Abused: Not on file  . 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  Allergies  Allergen Reactions  . Statins Other (See Comments)    Severe muscle pains     Outpatient Medications Prior to Visit  Medication Sig Dispense Refill  . Ascorbic Acid (VITAMIN C PO) Take 100 mg by mouth daily.    Marland Kitchen aspirin EC 325 MG tablet Take 325 mg by mouth daily.    Marland Kitchen atenolol (TENORMIN) 50 MG tablet Take 50 mg by mouth 2 (two) times daily.    . budesonide-formoterol (SYMBICORT) 160-4.5 MCG/ACT inhaler Inhale 2 puffs into the lungs 2 (two) times daily. 1 Inhaler 0  . buPROPion (WELLBUTRIN XL) 300 MG 24 hr tablet Take 300 mg by mouth daily.    . cholecalciferol (VITAMIN D) 1000 UNITS tablet Take 1,000 Units by mouth daily.    Marland Kitchen FLUoxetine (PROZAC) 20 MG capsule Take 80 mg by mouth daily. For depression and anxiety -4 tabs in am    . fluticasone (FLONASE) 50 MCG/ACT nasal spray Place 2 sprays into both nostrils daily. 16 g 0  . glipiZIDE (GLUCOTROL) 5 MG tablet Take 5 mg by mouth 2 (two) times daily.     Marland Kitchen HYDROcodone-acetaminophen (NORCO/VICODIN) 5-325 MG per tablet Take 2 tablets by mouth every 6 (six) hours as needed. 16 tablet 0  . insulin glargine (LANTUS) 100 unit/mL SOPN Inject 75 Units into the skin daily.     Marland Kitchen loperamide (IMODIUM) 2 MG capsule Take 4 mg by mouth daily.     . metFORMIN (GLUCOPHAGE) 1000 MG tablet Take 1,000 mg by  mouth 2 (two) times daily with a meal.    . metoprolol tartrate (LOPRESSOR) 25 MG tablet Take 1 tablet (25 mg total) by mouth 2 (two) times daily. 60 tablet 0  . modafinil (PROVIGIL) 100 MG tablet Take 300 mg by mouth daily.    . NON FORMULARY CPAP Machine daily    . Omega-3 Fatty Acids (FISH OIL) 1000 MG CAPS Take 1,000 mg by mouth daily.     Marland Kitchen omeprazole (PRILOSEC) 20 MG capsule Take 20 mg by mouth daily.    . prazosin (MINIPRESS) 5 MG capsule Take 15 mg by mouth at bedtime. For nightmares and flashbacks     . pregabalin (LYRICA) 300 MG capsule Take 300 mg by mouth  2 (two) times daily.     No facility-administered medications prior to visit.        Objective:   Physical Exam Vitals:   11/23/19 1408  BP: 128/60  Pulse: 71  Temp: (!) 97.1 F (36.2 C)  TempSrc: Temporal  SpO2: 97%  Weight: 268 lb (121.6 kg)  Height: 5\' 11"  (1.803 m)   Gen: Pleasant, obese man, in no distress,  normal affect  ENT: No lesions,  mouth clear,  oropharynx clear, no postnasal drip  Neck: No JVD, no stridor  Lungs: No use of accessory muscles, no crackles or wheezing on normal respiration, no wheeze on forced expiration  Cardiovascular: RRR, heart sounds normal, no murmur or gallops, no peripheral edema  Musculoskeletal: No deformities, no cyanosis or clubbing  Neuro: alert, awake, non focal  Skin: Warm, no lesions or rash      Assessment & Plan:  Asthma-COPD overlap syndrome (HCC) Mild obstruction based only on curve of his flow volume loop.  He has limited functional capacity and does have exertional dyspnea but I think this is primarily to deconditioning.  He is not sure if Symbicort is helping him.  We will do an experiment of stopping it for 2 weeks to see if he tolerates.  If he misses it he will restart.  Also he needs an albuterol inhaler to keep available for symptoms.  We will write a prescription for this today.  OSA (obstructive sleep apnea) Difficulty using his CPAP, at least in  part due to nasal obstruction.  Also due to leak and the noise of the machine.  I encouraged him to wear as reliably as possible.  We will try to treat his allergies more aggressively.  Allergic rhinitis Only using his Flonase as needed.  We will increase back to once daily every day  GERD (gastroesophageal reflux disease) Potential contributor to cough.  We will continue this once daily  , MD, PhD 11/23/2019, 2:37 PM [facility] [phone] or if no answer [phone]

## 2020-12-10 DIAGNOSIS — E1042 Type 1 diabetes mellitus with diabetic polyneuropathy: Secondary | ICD-10-CM | POA: Diagnosis not present

## 2020-12-10 DIAGNOSIS — G4733 Obstructive sleep apnea (adult) (pediatric): Secondary | ICD-10-CM | POA: Diagnosis not present

## 2020-12-10 DIAGNOSIS — R69 Illness, unspecified: Secondary | ICD-10-CM | POA: Diagnosis not present

## 2020-12-10 DIAGNOSIS — Z794 Long term (current) use of insulin: Secondary | ICD-10-CM | POA: Diagnosis not present

## 2020-12-10 DIAGNOSIS — G2581 Restless legs syndrome: Secondary | ICD-10-CM | POA: Diagnosis not present

## 2020-12-10 DIAGNOSIS — G4730 Sleep apnea, unspecified: Secondary | ICD-10-CM | POA: Diagnosis not present

## 2020-12-10 DIAGNOSIS — E785 Hyperlipidemia, unspecified: Secondary | ICD-10-CM | POA: Diagnosis not present

## 2020-12-10 DIAGNOSIS — J449 Chronic obstructive pulmonary disease, unspecified: Secondary | ICD-10-CM | POA: Diagnosis not present

## 2020-12-10 DIAGNOSIS — G8929 Other chronic pain: Secondary | ICD-10-CM | POA: Diagnosis not present

## 2020-12-25 ENCOUNTER — Other Ambulatory Visit: Payer: Self-pay

## 2020-12-25 ENCOUNTER — Encounter: Payer: Self-pay | Admitting: Emergency Medicine

## 2020-12-25 ENCOUNTER — Ambulatory Visit (INDEPENDENT_AMBULATORY_CARE_PROVIDER_SITE_OTHER): Payer: Medicare HMO | Admitting: Emergency Medicine

## 2020-12-25 DIAGNOSIS — G4733 Obstructive sleep apnea (adult) (pediatric): Secondary | ICD-10-CM

## 2020-12-25 DIAGNOSIS — J449 Chronic obstructive pulmonary disease, unspecified: Secondary | ICD-10-CM | POA: Diagnosis not present

## 2020-12-25 DIAGNOSIS — J4489 Other specified chronic obstructive pulmonary disease: Secondary | ICD-10-CM

## 2020-12-25 MED ORDER — BUDESONIDE-FORMOTEROL FUMARATE 160-4.5 MCG/ACT IN AERO: 2.0000 | INHALATION_SPRAY | Freq: Two times a day (BID) | RESPIRATORY_TRACT | 0 refills | Status: DC

## 2020-12-25 NOTE — Progress Notes (Signed)
Subjective:    Patient ID: Vincent Richmond, male    DOB: [birth year], 75 y.o.   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  He is followed at the [place], is referred today for evaluation of presumed COPD, continued symptoms.  He was admitted to [facility] with an apparent acute exacerbation of COPD with associated hypoxemic respiratory failure in late January of this year. Even after d/c he continued to have cough, a bit better over the last week.  Currently managed on Symbicort + albuterol (new), Flonase prn, Claritin prn, omeprazole.  ROV 11/23/19 --75 year old gentleman with a history of COPD, allergic rhinitis with normal eosinophil count, IgE 197 (October 2020).  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  Currently managed on Symbicort, Flonase prn, omeprazole daily. He was recently treated for otitis, better but still has some intermittent B ear pain. He is unsure whether Symbicort helps him. His exertional tolerance is poor, but his activity level is low. He doesn't push himself. He doesn't have an albuterol inhaler. He is compliant with CPAP "in streaks" > some nights he has more difficulty. Wakes him up due to the noise, to leakage.   ROV 12/25/20 --this an annual follow-up visit for 75 year old man with history of COPD, OSA on CPAP.  He also has a history of remote positive PPD, hypertension, diabetes, IBS. He reports today that he tried stopping his Symbicort, has been off of it since last time. His wife noticed increased cough, but he hasn't restarted.  Bronchodilator regimen includes albuterol prn, about once a day. Usually for cough Good CPAP compliance  Review of Systems  Constitutional: Negative for activity change, appetite change, chills, diaphoresis, fatigue, fever and unexpected weight change.  HENT:  Positive for postnasal drip, sore throat and trouble swallowing. Negative for congestion, dental problem, nosebleeds, rhinorrhea, sinus pressure, sneezing and voice change.   Eyes: Negative for itching and visual disturbance.  Respiratory: Positive for cough, shortness of breath and wheezing. Negative for choking, chest tightness and stridor.   Cardiovascular: Positive for chest pain and palpitations. Negative for leg swelling.  Gastrointestinal: Negative for abdominal pain.  Musculoskeletal: Negative for joint swelling and myalgias.  Skin: Negative for rash.  Neurological: Positive for headaches. Negative for syncope and light-headedness.  Psychiatric/Behavioral: Positive for agitation and dysphoric mood. Negative for sleep disturbance.     Past Medical History:  Diagnosis Date  . Diabetes mellitus without complication (HCC)   . Hypertension   . PTSD (post-traumatic stress disorder)   . Stroke [facility])      Family History  Problem Relation Age of Onset  . Heart disease Father   . Cancer Father      Social History   Socioeconomic History  . Marital status: Married    Spouse name: Not on file  . Number of children: Not on file  . Years of education: Not on file  . Highest education level: Not on file  Occupational History  . Not on file  Tobacco Use  . Smoking status: Former Smoker    Packs/day: 1.00    Years: 21.00    Pack years: 21.00    Types: Cigarettes    Start date: 60    Quit date: 10/07/1975    Years since quitting: 45.2  . Smokeless tobacco: Never Used  Vaping Use  . Vaping  Use: Never used  Substance and Sexual Activity  . Alcohol use: No  . Drug use: Never  . Sexual activity: Yes  Other Topics Concern  . Not on file  Social History Narrative  . 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  Allergies  Allergen Reactions  . Statins Other (See Comments)    Severe muscle pains     Outpatient Medications Prior to Visit  Medication Sig Dispense Refill  . albuterol (VENTOLIN HFA) 108 (90 Base) MCG/ACT inhaler Inhale 2 puffs into the lungs every 6 (six) hours as needed for wheezing or shortness of breath. 18 g 5  . Ascorbic Acid (VITAMIN C PO) Take 100 mg by mouth daily.    Marland Kitchen aspirin EC 325 MG tablet Take 325 mg by mouth daily.    Marland Kitchen atenolol (TENORMIN) 50 MG tablet Take 50 mg by mouth 2 (two) times daily.    . budesonide-formoterol (SYMBICORT) 160-4.5 MCG/ACT inhaler Inhale 2 puffs into the lungs 2 (two) times daily. 1 Inhaler 0  . buPROPion (WELLBUTRIN XL) 300 MG 24 hr tablet Take 300 mg by mouth daily.    . cholecalciferol (VITAMIN D) 1000 UNITS tablet Take 1,000 Units by mouth daily.    Marland Kitchen FLUoxetine (PROZAC) 20 MG capsule Take 80 mg by mouth daily. For depression and anxiety -4 tabs in am    . fluticasone (FLONASE) 50 MCG/ACT nasal spray Place 2 sprays into both nostrils daily. 16 g 0  . glipiZIDE (GLUCOTROL) 5 MG tablet Take 5 mg by mouth 2 (two) times daily.     Marland Kitchen HYDROcodone-acetaminophen (NORCO/VICODIN) 5-325 MG per tablet Take 2 tablets by mouth every 6 (six) hours as needed. 16 tablet 0  . insulin glargine (LANTUS) 100 unit/mL SOPN Inject 75 Units into the skin daily.     Marland Kitchen loperamide (IMODIUM) 2 MG capsule Take 4 mg by mouth daily.     . metFORMIN (GLUCOPHAGE) 1000 MG tablet Take 1,000 mg by mouth 2 (two) times daily with a meal.    . metoprolol tartrate (LOPRESSOR) 25 MG tablet Take 1 tablet (25 mg total) by mouth 2 (two) times daily. 60 tablet 0  . modafinil (PROVIGIL) 100 MG tablet Take 300 mg by mouth daily.    . NON FORMULARY CPAP Machine daily    . Omega-3 Fatty Acids (FISH OIL) 1000 MG CAPS Take 1,000 mg by mouth  daily.     Marland Kitchen omeprazole (PRILOSEC) 20 MG capsule Take 20 mg by mouth daily.    . prazosin (MINIPRESS) 5 MG capsule Take 15 mg by mouth at bedtime. For nightmares and flashbacks    . pregabalin (LYRICA) 300 MG capsule Take 300 mg by mouth 2 (two) times daily.     No facility-administered medications prior to visit.        Objective:   Physical Exam Vitals:   12/25/20 1620  BP: (!) 146/74  Pulse: 74  Temp: (!) 97.5 F (36.4 C)  TempSrc: Temporal  SpO2: 95%  Weight: 271 lb 9.6 oz (123.2 kg)  Height: 5\' 11"  (1.803 m)   Gen: Pleasant, obese man, in no distress,  normal affect  ENT: No lesions,  mouth clear,  oropharynx clear, no postnasal drip  Neck: No JVD, no stridor  Lungs: No use of accessory muscles, no crackles or wheezing on normal respiration, no wheeze on forced expiration  Cardiovascular: RRR, heart sounds normal, no murmur or gallops, no peripheral edema  Musculoskeletal: No deformities, no cyanosis or clubbing  Neuro: alert, awake, non focal  Skin: Warm, no lesions or rash      Assessment & Plan:  Asthma-COPD overlap syndrome (HCC) He is not sure whether the Symbicort actually helps him.  He has good functional capacity he reports although does sometimes get short of breath with exertion.  He does cough more, question whether the Symbicort was helping with this.  He is not been on it for months.  He is willing to try to restart see if he gets benefit.  Keep his albuterol available to use if needed.  OSA (obstructive sleep apnea) CPAP every night  Levy Pupa, MD, PhD 12/25/2020, 4:48 PM  [facility] Care [phone] or if no answer [phone]

## 2020-12-25 NOTE — Patient Instructions (Signed)
We will restart Symbicort 160/4.5 mcg, 2 puffs twice a day.  Rinse and gargle after use this medication. Keep your albuterol available to use 2 puffs if you need if shortness of breath, chest tightness, wheezing, cough. Wear CPAP every night while sleeping. Follow with Dr. Delton Coombes in 12 months or sooner if you have any problems.

## 2020-12-25 NOTE — Assessment & Plan Note (Signed)
He is not sure whether the Symbicort actually helps him.  He has good functional capacity he reports although does sometimes get short of breath with exertion.  He does cough more, question whether the Symbicort was helping with this.  He is not been on it for months.  He is willing to try to restart see if he gets benefit.  Keep his albuterol available to use if needed.

## 2020-12-25 NOTE — Addendum Note (Signed)
Addended by: [name] R on: 12/25/2020 04:51 PM   Modules accepted: Orders

## 2020-12-25 NOTE — Assessment & Plan Note (Signed)
CPAP every night. °

## 2021-09-13 ENCOUNTER — Inpatient Hospital Stay (HOSPITAL_COMMUNITY)
Admission: EM | Admit: 2021-09-13 | Discharge: 2021-09-18 | DRG: 202 | Disposition: A | Discharge status: Home / Self Care | Payer: No Typology Code available for payment source | Attending: Internal Medicine | Admitting: Internal Medicine

## 2021-09-13 DIAGNOSIS — Z7984 Long term (current) use of oral hypoglycemic drugs: Secondary | ICD-10-CM

## 2021-09-13 DIAGNOSIS — Z888 Allergy status to other drugs, medicaments and biological substances status: Secondary | ICD-10-CM

## 2021-09-13 DIAGNOSIS — Z6838 Body mass index (BMI) 38.0-38.9, adult: Secondary | ICD-10-CM

## 2021-09-13 DIAGNOSIS — E1165 Type 2 diabetes mellitus with hyperglycemia: Secondary | ICD-10-CM | POA: Diagnosis not present

## 2021-09-13 DIAGNOSIS — R0602 Shortness of breath: Secondary | ICD-10-CM | POA: Diagnosis not present

## 2021-09-13 DIAGNOSIS — J449 Chronic obstructive pulmonary disease, unspecified: Secondary | ICD-10-CM | POA: Diagnosis present

## 2021-09-13 DIAGNOSIS — Z20822 Contact with and (suspected) exposure to covid-19: Secondary | ICD-10-CM | POA: Diagnosis present

## 2021-09-13 DIAGNOSIS — I10 Essential (primary) hypertension: Secondary | ICD-10-CM

## 2021-09-13 DIAGNOSIS — J45901 Unspecified asthma with (acute) exacerbation: Principal | ICD-10-CM | POA: Diagnosis present

## 2021-09-13 DIAGNOSIS — K219 Gastro-esophageal reflux disease without esophagitis: Secondary | ICD-10-CM | POA: Diagnosis present

## 2021-09-13 DIAGNOSIS — F39 Unspecified mood [affective] disorder: Secondary | ICD-10-CM | POA: Diagnosis present

## 2021-09-13 DIAGNOSIS — Z79899 Other long term (current) drug therapy: Secondary | ICD-10-CM

## 2021-09-13 DIAGNOSIS — J441 Chronic obstructive pulmonary disease with (acute) exacerbation: Secondary | ICD-10-CM | POA: Diagnosis present

## 2021-09-13 DIAGNOSIS — Z7982 Long term (current) use of aspirin: Secondary | ICD-10-CM

## 2021-09-13 DIAGNOSIS — T380X5A Adverse effect of glucocorticoids and synthetic analogues, initial encounter: Secondary | ICD-10-CM | POA: Diagnosis present

## 2021-09-13 DIAGNOSIS — E119 Type 2 diabetes mellitus without complications: Secondary | ICD-10-CM

## 2021-09-13 DIAGNOSIS — F4310 Post-traumatic stress disorder, unspecified: Secondary | ICD-10-CM | POA: Diagnosis present

## 2021-09-13 DIAGNOSIS — G4733 Obstructive sleep apnea (adult) (pediatric): Secondary | ICD-10-CM | POA: Diagnosis present

## 2021-09-13 DIAGNOSIS — E669 Obesity, unspecified: Secondary | ICD-10-CM | POA: Diagnosis present

## 2021-09-13 DIAGNOSIS — R042 Hemoptysis: Secondary | ICD-10-CM | POA: Diagnosis present

## 2021-09-13 DIAGNOSIS — J9601 Acute respiratory failure with hypoxia: Secondary | ICD-10-CM | POA: Diagnosis not present

## 2021-09-13 DIAGNOSIS — Z809 Family history of malignant neoplasm, unspecified: Secondary | ICD-10-CM

## 2021-09-13 DIAGNOSIS — D72829 Elevated white blood cell count, unspecified: Secondary | ICD-10-CM

## 2021-09-13 DIAGNOSIS — Z8249 Family history of ischemic heart disease and other diseases of the circulatory system: Secondary | ICD-10-CM

## 2021-09-13 DIAGNOSIS — Z87891 Personal history of nicotine dependence: Secondary | ICD-10-CM

## 2021-09-13 DIAGNOSIS — Z794 Long term (current) use of insulin: Secondary | ICD-10-CM

## 2021-09-13 DIAGNOSIS — Z8673 Personal history of transient ischemic attack (TIA), and cerebral infarction without residual deficits: Secondary | ICD-10-CM

## 2021-09-14 ENCOUNTER — Emergency Department (HOSPITAL_COMMUNITY): Payer: No Typology Code available for payment source

## 2021-09-14 ENCOUNTER — Other Ambulatory Visit: Payer: Self-pay

## 2021-09-14 ENCOUNTER — Encounter (HOSPITAL_COMMUNITY): Payer: Self-pay | Admitting: Internal Medicine

## 2021-09-14 DIAGNOSIS — J45901 Unspecified asthma with (acute) exacerbation: Secondary | ICD-10-CM | POA: Diagnosis present

## 2021-09-14 DIAGNOSIS — F4310 Post-traumatic stress disorder, unspecified: Secondary | ICD-10-CM | POA: Diagnosis present

## 2021-09-14 DIAGNOSIS — D72829 Elevated white blood cell count, unspecified: Secondary | ICD-10-CM

## 2021-09-14 DIAGNOSIS — Z7984 Long term (current) use of oral hypoglycemic drugs: Secondary | ICD-10-CM | POA: Diagnosis not present

## 2021-09-14 DIAGNOSIS — Z20822 Contact with and (suspected) exposure to covid-19: Secondary | ICD-10-CM | POA: Diagnosis present

## 2021-09-14 DIAGNOSIS — K219 Gastro-esophageal reflux disease without esophagitis: Secondary | ICD-10-CM | POA: Diagnosis present

## 2021-09-14 DIAGNOSIS — E1165 Type 2 diabetes mellitus with hyperglycemia: Secondary | ICD-10-CM | POA: Diagnosis not present

## 2021-09-14 DIAGNOSIS — Z87891 Personal history of nicotine dependence: Secondary | ICD-10-CM | POA: Diagnosis not present

## 2021-09-14 DIAGNOSIS — Z888 Allergy status to other drugs, medicaments and biological substances status: Secondary | ICD-10-CM | POA: Diagnosis not present

## 2021-09-14 DIAGNOSIS — J449 Chronic obstructive pulmonary disease, unspecified: Secondary | ICD-10-CM | POA: Diagnosis present

## 2021-09-14 DIAGNOSIS — Z6838 Body mass index (BMI) 38.0-38.9, adult: Secondary | ICD-10-CM | POA: Diagnosis not present

## 2021-09-14 DIAGNOSIS — Z8249 Family history of ischemic heart disease and other diseases of the circulatory system: Secondary | ICD-10-CM | POA: Diagnosis not present

## 2021-09-14 DIAGNOSIS — Z79899 Other long term (current) drug therapy: Secondary | ICD-10-CM | POA: Diagnosis not present

## 2021-09-14 DIAGNOSIS — J441 Chronic obstructive pulmonary disease with (acute) exacerbation: Secondary | ICD-10-CM | POA: Diagnosis present

## 2021-09-14 DIAGNOSIS — T380X5A Adverse effect of glucocorticoids and synthetic analogues, initial encounter: Secondary | ICD-10-CM | POA: Diagnosis present

## 2021-09-14 DIAGNOSIS — Z809 Family history of malignant neoplasm, unspecified: Secondary | ICD-10-CM | POA: Diagnosis not present

## 2021-09-14 DIAGNOSIS — R042 Hemoptysis: Secondary | ICD-10-CM | POA: Diagnosis present

## 2021-09-14 DIAGNOSIS — Z8673 Personal history of transient ischemic attack (TIA), and cerebral infarction without residual deficits: Secondary | ICD-10-CM | POA: Diagnosis not present

## 2021-09-14 DIAGNOSIS — G4733 Obstructive sleep apnea (adult) (pediatric): Secondary | ICD-10-CM | POA: Diagnosis present

## 2021-09-14 DIAGNOSIS — F39 Unspecified mood [affective] disorder: Secondary | ICD-10-CM | POA: Diagnosis present

## 2021-09-14 DIAGNOSIS — R0602 Shortness of breath: Secondary | ICD-10-CM | POA: Diagnosis present

## 2021-09-14 DIAGNOSIS — Z794 Long term (current) use of insulin: Secondary | ICD-10-CM | POA: Diagnosis not present

## 2021-09-14 DIAGNOSIS — Z7982 Long term (current) use of aspirin: Secondary | ICD-10-CM | POA: Diagnosis not present

## 2021-09-14 DIAGNOSIS — I10 Essential (primary) hypertension: Secondary | ICD-10-CM | POA: Diagnosis present

## 2021-09-14 DIAGNOSIS — E669 Obesity, unspecified: Secondary | ICD-10-CM | POA: Diagnosis present

## 2021-09-14 DIAGNOSIS — J9601 Acute respiratory failure with hypoxia: Secondary | ICD-10-CM | POA: Diagnosis not present

## 2021-09-14 LAB — CBC WITH DIFFERENTIAL/PLATELET
Abs Immature Granulocytes: 0.18 10*3/uL — ABNORMAL HIGH (ref 0.00–0.07)
Basophils Absolute: 0.2 10*3/uL — ABNORMAL HIGH (ref 0.0–0.1)
Basophils Relative: 1 %
Eosinophils Absolute: 0.5 10*3/uL (ref 0.0–0.5)
Eosinophils Relative: 4 %
HCT: 40.1 % (ref 39.0–52.0)
Hemoglobin: 13.1 g/dL (ref 13.0–17.0)
Immature Granulocytes: 2 %
Lymphocytes Relative: 19 %
Lymphs Abs: 2.3 10*3/uL (ref 0.7–4.0)
MCH: 29 pg (ref 26.0–34.0)
MCHC: 32.7 g/dL (ref 30.0–36.0)
MCV: 88.7 fL (ref 80.0–100.0)
Monocytes Absolute: 0.8 10*3/uL (ref 0.1–1.0)
Monocytes Relative: 7 %
Neutro Abs: 8.3 10*3/uL — ABNORMAL HIGH (ref 1.7–7.7)
Neutrophils Relative %: 67 %
Platelets: 262 10*3/uL (ref 150–400)
RBC: 4.52 MIL/uL (ref 4.22–5.81)
RDW: 13.7 % (ref 11.5–15.5)
WBC: 12.2 10*3/uL — ABNORMAL HIGH (ref 4.0–10.5)
nRBC: 0 % (ref 0.0–0.2)

## 2021-09-14 LAB — BASIC METABOLIC PANEL
Anion gap: 9 (ref 5–15)
BUN: 10 mg/dL (ref 8–23)
CO2: 26 mmol/L (ref 22–32)
Calcium: 8.7 mg/dL — ABNORMAL LOW (ref 8.9–10.3)
Chloride: 100 mmol/L (ref 98–111)
Creatinine, Ser: 0.82 mg/dL (ref 0.61–1.24)
GFR, Estimated: >60 mL/min (ref >60)
Glucose, Bld: 189 mg/dL — ABNORMAL HIGH (ref 70–99)
Potassium: 4.1 mmol/L (ref 3.5–5.1)
Sodium: 135 mmol/L (ref 135–145)

## 2021-09-14 LAB — D-DIMER, QUANTITATIVE: D-Dimer, Quant: 0.28 ug/mL-FEU (ref 0.00–0.50)

## 2021-09-14 LAB — GLUCOSE, CAPILLARY
Glucose-Capillary: 281 mg/dL — ABNORMAL HIGH (ref 70–99)
Glucose-Capillary: 200 mg/dL — ABNORMAL HIGH (ref 70–99)

## 2021-09-14 LAB — RESP PANEL BY RT-PCR (FLU A&B, COVID) ARPGX2
Influenza A by PCR: NEGATIVE
Influenza B by PCR: NEGATIVE
SARS Coronavirus 2 by RT PCR: NEGATIVE

## 2021-09-14 LAB — CBG MONITORING, ED
Glucose-Capillary: 308 mg/dL — ABNORMAL HIGH (ref 70–99)
Glucose-Capillary: 229 mg/dL — ABNORMAL HIGH (ref 70–99)

## 2021-09-14 LAB — HEMOGLOBIN A1C
Hgb A1c MFr Bld: 7.8 % — ABNORMAL HIGH (ref 4.8–5.6)
Mean Plasma Glucose: 177.16 mg/dL

## 2021-09-14 MED ORDER — SODIUM CHLORIDE 0.9 % IV SOLN
500.0000 mg | INTRAVENOUS | Status: DC
  Administered 2021-09-14 – 2021-09-15 (×2): 500 mg via INTRAVENOUS
  Filled 2021-09-14 (×3): qty 5

## 2021-09-14 MED ORDER — MAGNESIUM SULFATE 2 GM/50ML IV SOLN
2.0000 g | INTRAVENOUS | Status: AC
  Administered 2021-09-14: 2 g via INTRAVENOUS
  Filled 2021-09-14: qty 50

## 2021-09-14 MED ORDER — IPRATROPIUM-ALBUTEROL 0.5-2.5 (3) MG/3ML IN SOLN
3.0000 mL | Freq: Four times a day (QID) | RESPIRATORY_TRACT | Status: DC
  Administered 2021-09-14 – 2021-09-15 (×5): 3 mL via RESPIRATORY_TRACT
  Filled 2021-09-14 (×5): qty 3

## 2021-09-14 MED ORDER — ACETAMINOPHEN 325 MG PO TABS: 650.0000 mg | ORAL_TABLET | Freq: Four times a day (QID) | ORAL | Status: DC | PRN

## 2021-09-14 MED ORDER — IPRATROPIUM-ALBUTEROL 0.5-2.5 (3) MG/3ML IN SOLN
RESPIRATORY_TRACT | Status: AC
  Filled 2021-09-14: qty 3

## 2021-09-14 MED ORDER — ACETAMINOPHEN 650 MG RE SUPP: 650.0000 mg | Freq: Four times a day (QID) | RECTAL | Status: DC | PRN

## 2021-09-14 MED ORDER — PANTOPRAZOLE SODIUM 40 MG PO TBEC
40.0000 mg | DELAYED_RELEASE_TABLET | Freq: Every day | ORAL | Status: DC
  Administered 2021-09-14 – 2021-09-18 (×5): 40 mg via ORAL
  Filled 2021-09-14 (×5): qty 1

## 2021-09-14 MED ORDER — IPRATROPIUM-ALBUTEROL 0.5-2.5 (3) MG/3ML IN SOLN
3.0000 mL | Freq: Once | RESPIRATORY_TRACT | Status: AC
  Administered 2021-09-14: 3 mL via RESPIRATORY_TRACT

## 2021-09-14 MED ORDER — ASPIRIN EC 325 MG PO TBEC: 325.0000 mg | DELAYED_RELEASE_TABLET | Freq: Every day | ORAL | Status: DC

## 2021-09-14 MED ORDER — ALBUTEROL SULFATE (2.5 MG/3ML) 0.083% IN NEBU
2.5000 mg | INHALATION_SOLUTION | RESPIRATORY_TRACT | Status: DC | PRN
  Administered 2021-09-14 (×2): 2.5 mg via RESPIRATORY_TRACT
  Filled 2021-09-14 (×2): qty 3

## 2021-09-14 MED ORDER — ENOXAPARIN SODIUM 40 MG/0.4ML IJ SOSY: 40.0000 mg | PREFILLED_SYRINGE | INTRAMUSCULAR | Status: DC

## 2021-09-14 MED ORDER — ALBUTEROL SULFATE (2.5 MG/3ML) 0.083% IN NEBU
10.0000 mg/h | INHALATION_SOLUTION | RESPIRATORY_TRACT | Status: DC
  Administered 2021-09-14: 10 mg/h via RESPIRATORY_TRACT
  Filled 2021-09-14: qty 12

## 2021-09-14 MED ORDER — FLUOXETINE HCL 20 MG PO CAPS
80.0000 mg | ORAL_CAPSULE | Freq: Every day | ORAL | Status: DC
  Administered 2021-09-14 – 2021-09-18 (×5): 80 mg via ORAL
  Filled 2021-09-14 (×5): qty 4

## 2021-09-14 MED ORDER — MAGNESIUM SULFATE 2 GM/50ML IV SOLN
2.0000 g | Freq: Once | INTRAVENOUS | Status: AC
  Administered 2021-09-14: 2 g via INTRAVENOUS
  Filled 2021-09-14: qty 50

## 2021-09-14 MED ORDER — PREGABALIN 100 MG PO CAPS
300.0000 mg | ORAL_CAPSULE | Freq: Two times a day (BID) | ORAL | Status: DC
  Administered 2021-09-14 – 2021-09-18 (×9): 300 mg via ORAL
  Filled 2021-09-14 (×9): qty 3

## 2021-09-14 MED ORDER — METOPROLOL TARTRATE 25 MG PO TABS
25.0000 mg | ORAL_TABLET | Freq: Two times a day (BID) | ORAL | Status: DC
  Administered 2021-09-14 – 2021-09-15 (×4): 25 mg via ORAL
  Filled 2021-09-14 (×4): qty 1

## 2021-09-14 MED ORDER — METHYLPREDNISOLONE SODIUM SUCC 125 MG IJ SOLR
125.0000 mg | Freq: Once | INTRAMUSCULAR | Status: AC
  Administered 2021-09-14: 125 mg via INTRAVENOUS
  Filled 2021-09-14: qty 2

## 2021-09-14 MED ORDER — METHYLPREDNISOLONE SODIUM SUCC 125 MG IJ SOLR
90.0000 mg | Freq: Two times a day (BID) | INTRAMUSCULAR | Status: DC
  Administered 2021-09-14 – 2021-09-15 (×2): 90 mg via INTRAVENOUS
  Filled 2021-09-14 (×2): qty 2

## 2021-09-14 MED ORDER — BUDESONIDE 0.5 MG/2ML IN SUSP
2.0000 mg | Freq: Two times a day (BID) | RESPIRATORY_TRACT | Status: DC
  Administered 2021-09-14 (×2): 2 mg via RESPIRATORY_TRACT
  Administered 2021-09-15: 0.25 mg via RESPIRATORY_TRACT
  Administered 2021-09-16: 0.5 mg via RESPIRATORY_TRACT
  Administered 2021-09-16 – 2021-09-17 (×2): 2 mg via RESPIRATORY_TRACT
  Administered 2021-09-17: 0.5 mg via RESPIRATORY_TRACT
  Administered 2021-09-18: 07:00:00 2 mg via RESPIRATORY_TRACT
  Filled 2021-09-14 (×8): qty 8

## 2021-09-14 MED ORDER — BUPROPION HCL ER (XL) 150 MG PO TB24
300.0000 mg | ORAL_TABLET | Freq: Every day | ORAL | Status: DC
  Administered 2021-09-14 – 2021-09-18 (×5): 300 mg via ORAL
  Filled 2021-09-14 (×4): qty 2
  Filled 2021-09-14: qty 1

## 2021-09-14 MED ORDER — INSULIN ASPART 100 UNIT/ML IJ SOLN
0.0000 [IU] | Freq: Three times a day (TID) | INTRAMUSCULAR | Status: DC
  Administered 2021-09-14: 11 [IU] via SUBCUTANEOUS
  Administered 2021-09-14: 5 [IU] via SUBCUTANEOUS
  Administered 2021-09-14 – 2021-09-15 (×2): 8 [IU] via SUBCUTANEOUS
  Administered 2021-09-15: 11 [IU] via SUBCUTANEOUS
  Administered 2021-09-15 – 2021-09-16 (×2): 8 [IU] via SUBCUTANEOUS
  Administered 2021-09-16 (×2): 5 [IU] via SUBCUTANEOUS
  Administered 2021-09-17: 3 [IU] via SUBCUTANEOUS
  Administered 2021-09-17 (×2): 5 [IU] via SUBCUTANEOUS
  Administered 2021-09-18: 09:00:00 8 [IU] via SUBCUTANEOUS

## 2021-09-14 MED ORDER — DM-GUAIFENESIN ER 30-600 MG PO TB12
1.0000 | ORAL_TABLET | Freq: Two times a day (BID) | ORAL | Status: DC
  Administered 2021-09-14 – 2021-09-18 (×9): 1 via ORAL
  Filled 2021-09-14 (×11): qty 1

## 2021-09-14 MED ORDER — INSULIN GLARGINE-YFGN 100 UNIT/ML ~~LOC~~ SOLN
65.0000 [IU] | Freq: Every day | SUBCUTANEOUS | Status: DC
  Administered 2021-09-14: 65 [IU] via SUBCUTANEOUS
  Filled 2021-09-14 (×2): qty 0.65

## 2021-09-14 MED ORDER — PRAZOSIN HCL 2 MG PO CAPS
15.0000 mg | ORAL_CAPSULE | Freq: Every day | ORAL | Status: DC
  Administered 2021-09-14 – 2021-09-17 (×4): 15 mg via ORAL
  Filled 2021-09-14 (×6): qty 1

## 2021-09-14 MED ORDER — INSULIN ASPART 100 UNIT/ML IJ SOLN
0.0000 [IU] | Freq: Every day | INTRAMUSCULAR | Status: DC
  Administered 2021-09-16: 3 [IU] via SUBCUTANEOUS

## 2021-09-14 MED ORDER — MODAFINIL 100 MG PO TABS
100.0000 mg | ORAL_TABLET | Freq: Three times a day (TID) | ORAL | Status: DC
  Administered 2021-09-14 – 2021-09-18 (×12): 100 mg via ORAL
  Filled 2021-09-14 (×12): qty 1

## 2021-09-14 NOTE — H&P (Signed)
History and Physical    Vincent Richmond JEH:[id number] DOB: 1945-12-29 DOA: 09/13/2021  PCP: [facility] Patient coming from: Home  Chief Complaint: Shortness of breath  HPI: Vincent Richmond is a 75 y.o. male with medical history significant of asthma-COPD overlap syndrome, OSA on CPAP, insulin-dependent type 2 diabetes, hypertension, stroke presented to the ED with a chief complaint of shortness of breath.  Wheezing on exam.  Not febrile or hypoxic.  WBC 12.2.  COVID and influenza PCR negative.  D-dimer within normal range.  Chest x-ray negative for acute process. Patient was given DuoNeb, IV Solu-Medrol 125 mg, albuterol continuous neb, and IV magnesium 2 g.  Patient states for over a week he is having difficulty and is wheezing.  He is also coughing a lot and today started coughing up blood.  He was seen by his PCP at the [place] a week ago and prescribed Tessalon Perles, Augmentin, Medrol Dosepak, and albuterol.  He is taking all of these medications but not improving.  Also reports subjective fevers.  Denies chest pain.  No other complaints.  Review of Systems:  All systems reviewed and apart from history of presenting illness, are negative.  Past Medical History:  Diagnosis Date   Diabetes mellitus without complication (HCC)    Hypertension    PTSD (post-traumatic stress disorder)    Stroke [facility])     Past Surgical History:  Procedure Laterality Date   TONSILLECTOMY     As A child     reports that he quit smoking about 45 years ago. His smoking use included cigarettes. He started smoking about 66 years ago. He has a 21.00 pack-year smoking history. He has never used smokeless tobacco. He reports that he does not drink alcohol and does not use drugs.  Allergies  Allergen Reactions   Pregabalin Diarrhea   Statins Other (See Comments)    Severe muscle pains    Family History  Problem Relation Age of Onset   Heart disease Father    Cancer Father     Prior to  Admission medications   Medication Sig Start Date End Date Taking? Authorizing Provider  albuterol (VENTOLIN HFA) 108 (90 Base) MCG/ACT inhaler Inhale 2 puffs into the lungs every 6 (six) hours as needed for wheezing or shortness of breath. 11/23/19  Yes Leslye Peer, MD  ALPHA LIPOIC ACID PO Take 1 tablet by mouth daily.   Yes [provider]  Ascorbic Acid (VITAMIN C PO) Take 1,000 mg by mouth daily.   Yes [provider]  aspirin EC 325 MG tablet Take 325 mg by mouth daily.   Yes [provider]  benzonatate (TESSALON) 100 MG capsule Take 100 mg by mouth 3 (three) times daily as needed for cough. 09/06/21  Yes [provider]  buPROPion (WELLBUTRIN XL) 300 MG 24 hr tablet Take 300 mg by mouth daily.   Yes [provider]  cholecalciferol (VITAMIN D) 1000 UNITS tablet Take 1,000 Units by mouth daily.   Yes [provider]  COLLAGEN PO Take 1 capsule by mouth daily.   Yes [provider]  FLUoxetine (PROZAC) 20 MG capsule Take 80 mg by mouth daily. For depression and anxiety -4 tabs in am   Yes [provider]  guaiFENesin (DIABETIC TUSSIN PO) Take 10 mLs by mouth 2 (two) times daily as needed (cough).   Yes [provider]  insulin glargine (LANTUS) 100 unit/mL SOPN Inject 65 Units into the skin at bedtime.   Yes  [provider]  Ipratropium-Albuterol (COMBIVENT RESPIMAT IN) Inhale 1 puff into the lungs 4 (four) times daily.   Yes [provider]  loperamide (IMODIUM) 2 MG capsule Take 4 mg by mouth daily.    Yes [provider]  metFORMIN (GLUCOPHAGE) 1000 MG tablet Take 1,000 mg by mouth 2 (two) times daily with a meal.   Yes [provider]  metoprolol tartrate (LOPRESSOR) 25 MG tablet Take 1 tablet (25 mg total) by mouth 2 (two) times daily. 11/01/18  Yes Ghimire, Werner Lean, MD  modafinil (PROVIGIL) 100 MG tablet Take 100 mg by mouth 3 (three) times daily.   Yes [provider]  Multiple Vitamins-Minerals (CENTRUM SILVER ADULT 50+ PO) Take 1 tablet by mouth daily.   Yes [provider]  NON FORMULARY CPAP Machine daily   Yes [provider]  Omega-3 Fatty Acids (FISH OIL) 1000 MG CAPS Take 1,000 mg by mouth daily.    Yes [provider]  omeprazole (PRILOSEC) 20 MG capsule Take 20 mg by mouth daily.   Yes [provider]  OVER THE COUNTER MEDICATION Take 1 tablet by mouth daily. Tumeric curcumin   Yes [provider]  pramipexole (MIRAPEX) 0.5 MG tablet Take 50 mg by mouth at bedtime. 06/18/10  Yes [provider]  prazosin (MINIPRESS) 5 MG capsule Take 15 mg by mouth at bedtime. For nightmares and flashbacks   Yes [provider]  pregabalin (LYRICA) 300 MG capsule Take 300 mg by mouth 2 (two) times daily.   Yes [provider]  Probiotic Product (PROBIOTIC DAILY PO) Take 1 capsule by mouth daily.   Yes [provider]  amoxicillin-clavulanate (AUGMENTIN) 875-125 MG tablet Take 1 tablet by mouth See admin instructions. Bid x 7 days Patient not taking: Reported on 09/14/2021 09/06/21   [provider]  budesonide-formoterol (SYMBICORT) 160-4.5 MCG/ACT inhaler Inhale 2 puffs into the lungs 2 (two) times daily. Patient not taking: Reported on 09/14/2021 12/25/20   Leslye Peer, MD  HYDROcodone-acetaminophen (NORCO/VICODIN) 5-325 MG per tablet Take 2 tablets by mouth every 6 (six) hours as needed. Patient not taking: Reported on 09/14/2021 02/18/15   Tegeler, Canary Brim, MD  methylPREDNISolone (MEDROL DOSEPAK) 4 MG TBPK tablet Take 4 mg by mouth See admin instructions. 6,5,4,3,2,1 Patient not taking: Reported on 09/14/2021 09/06/21   [provider]    Physical Exam: Vitals:   09/14/21 0145 09/14/21 0200 09/14/21 0215 09/14/21 0300  BP: (!) 158/65 (!) 157/64 (!) 164/62 (!) 155/57  Pulse: 70 70 73 79  Resp: 18 20 (!) 23 12  Temp:      TempSrc:       SpO2: 100% 100% 99% 92%    Physical Exam Constitutional:      General: He is not in acute distress. HENT:     Head: Normocephalic and atraumatic.  Eyes:     Extraocular Movements: Extraocular movements intact.     Conjunctiva/sclera: Conjunctivae normal.  Cardiovascular:     Rate and Rhythm: Normal rate and regular rhythm.     Pulses: Normal pulses.  Pulmonary:     Effort: Pulmonary effort is normal. No respiratory distress.     Breath sounds: Wheezing present. No rales.  Abdominal:     General: Bowel sounds are normal. There is no distension.     Palpations: Abdomen is soft.     Tenderness: There is no abdominal tenderness.  Musculoskeletal:        General: No swelling or  tenderness.     Cervical back: Normal range of motion and neck supple.  Skin:    General: Skin is warm and dry.  Neurological:     General: No focal deficit present.     Mental Status: He is alert and oriented to person, place, and time.     Labs on Admission: I have personally reviewed following labs and imaging studies  CBC: Recent Labs  Lab 09/14/21 0031  WBC 12.2*  NEUTROABS 8.3*  HGB 13.1  HCT 40.1  MCV 88.7  PLT 262   Basic Metabolic Panel: Recent Labs  Lab 09/14/21 0031  NA 135  K 4.1  CL 100  CO2 26  GLUCOSE 189*  BUN 10  CREATININE 0.82  CALCIUM 8.7*   GFR: CrCl cannot be calculated (Unknown ideal weight.). Liver Function Tests: No results for input(s): AST, ALT, ALKPHOS, BILITOT, PROT, ALBUMIN in the last 168 hours. No results for input(s): LIPASE, AMYLASE in the last 168 hours. No results for input(s): AMMONIA in the last 168 hours. Coagulation Profile: No results for input(s): INR, PROTIME in the last 168 hours. Cardiac Enzymes: No results for input(s): CKTOTAL, CKMB, CKMBINDEX, TROPONINI in the last 168 hours. BNP (last 3 results) No results for input(s): PROBNP in the last 8760 hours. HbA1C: No results for input(s): HGBA1C in the last 72 hours. CBG: No results  for input(s): GLUCAP in the last 168 hours. Lipid Profile: No results for input(s): CHOL, HDL, LDLCALC, TRIG, CHOLHDL, LDLDIRECT in the last 72 hours. Thyroid Function Tests: No results for input(s): TSH, T4TOTAL, FREET4, T3FREE, THYROIDAB in the last 72 hours. Anemia Panel: No results for input(s): VITAMINB12, FOLATE, FERRITIN, TIBC, IRON, RETICCTPCT in the last 72 hours. Urine analysis: No results found for: COLORURINE, APPEARANCEUR, LABSPEC, PHURINE, GLUCOSEU, HGBUR, BILIRUBINUR, KETONESUR, PROTEINUR, UROBILINOGEN, NITRITE, LEUKOCYTESUR  Radiological Exams on Admission: DG Chest Portable 1 View  Result Date: 09/14/2021 CLINICAL DATA:  Shortness of breath. EXAM: PORTABLE CHEST 1 VIEW COMPARISON:  10/30/2018. FINDINGS: The heart is enlarged and the mediastinal structures are within normal limits. No consolidation, effusion, or pneumothorax. Degenerative changes are present in the thoracic spine. No acute osseous abnormality. IMPRESSION: Cardiomegaly with no acute process. Electronically Signed   By: Thornell Sartorius M.D.   On: 09/14/2021 01:37    EKG: Independently reviewed.  Sinus rhythm, no acute ischemic changes.  Assessment/Plan Principal Problem:   COPD exacerbation (HCC) Active Problems:   Diabetes mellitus without complication (HCC)   HTN (hypertension)   OSA (obstructive sleep apnea)   Leukocytosis   Acute exacerbation of asthma-COPD overlap syndrome: Still wheezing but not hypoxic.  Continue treatment with Solu-Medrol 60 mg every 8 hours, DuoNeb every 6 hours, Pulmicort neb twice daily, albuterol neb every 2 hours as needed, and Mucinex DM.  He just finished 1 week course of Augmentin.  Chest x-ray negative for acute infectious process.  Pulmonary hygiene.  Continuous pulse ox, supplemental oxygen if needed.  Patient is coughing aggressively and reports hemoptysis at home.  Avoid antiplatelet agents/chemical DVT prophylaxis at this time.  Mild leukocytosis: Likely reactive from  steroid use.  OSA: Continue CPAP at night.  Insulin-dependent type 2 diabetes: Check A1c.  Continue home basal insulin.  Order sliding scale insulin moderate ACHS.   Hypertension: Slightly hypertensive.  Continue home meds.  Mood disorder: Continue home medications.  GERD: Continue PPI.  DVT prophylaxis: SCDs Code Status: Patient wishes to be full code. Family Communication: No family available at this time. Disposition Plan: Status is: Observation  The patient remains OBS appropriate and will d/c before 2 midnights.  Level of care:  Level of care: Telemetry Medical  The medical decision making on this patient was of high complexity and the patient is at high risk for clinical deterioration, therefore this is a level 3 visit.  John Giovanni MD [facility]  If 7PM-7AM, please contact night-coverage [email or web address]  09/14/2021, 3:44 AM

## 2021-09-14 NOTE — ED Provider Notes (Addendum)
[facility] EMERGENCY DEPARTMENT Provider Note   CSN: [id number] Arrival date & time: 09/13/21  2335     History Chief Complaint  Patient presents with   Shortness of Breath    Vincent Richmond is a 75 y.o. male.  Pt presents to the ED today with sob.  Pt has been sob for a week.  He was seen at the [place] 1 week ago and was put on tessalon perles, amox, albuterol, and a medrol dose pack.  Pt said he feels no better.  He has wheezes and cough.  No fevers.  Pt was admitted in 2020 for similar complaints.      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   PTSD (post-traumatic stress disorder)    Diabetes mellitus without complication (HCC)    Hypertension     Past Surgical History:  Procedure Laterality Date   TONSILLECTOMY     As A child       Family History  Problem Relation Age of Onset   Heart disease Father    Cancer Father     Social History   Tobacco Use   Smoking status: Former    Packs/day: 1.00    Years: 21.00    Pack years: 21.00    Types: Cigarettes    Start date: 53    Quit date: 10/07/1975    Years since quitting: 45.9   Smokeless tobacco: Never  Vaping Use   Vaping Use: Never used  Substance Use Topics   Alcohol use: No   Drug use: Never    Home Medications Prior to Admission medications   Medication Sig Start Date End Date Taking? Authorizing Provider  albuterol (VENTOLIN HFA) 108 (90 Base) MCG/ACT inhaler Inhale 2 puffs into the lungs every 6 (six) hours as needed for wheezing or shortness of breath. 11/23/19  Yes Leslye Peer, MD  ALPHA LIPOIC ACID  PO Take 1 tablet by mouth daily.   Yes [provider]  Ascorbic Acid (VITAMIN C PO) Take 1,000 mg by mouth daily.   Yes [provider]  aspirin EC 325 MG tablet Take 325 mg by mouth daily.   Yes [provider]  benzonatate (TESSALON) 100 MG capsule Take 100 mg by mouth 3 (three) times daily as needed for cough. 09/06/21  Yes [provider]  buPROPion (WELLBUTRIN XL) 300 MG 24 hr tablet Take 300 mg by mouth daily.   Yes [provider]  cholecalciferol (VITAMIN D) 1000 UNITS tablet Take 1,000 Units by mouth daily.   Yes [provider]  COLLAGEN PO Take 1 capsule by mouth daily.   Yes [provider]  FLUoxetine (PROZAC) 20 MG capsule Take 80 mg by mouth daily. For depression and anxiety -4 tabs in am   Yes [provider]  guaiFENesin (DIABETIC TUSSIN PO) Take 10 mLs by mouth 2 (two) times daily as needed (cough).   Yes [provider]  insulin glargine (LANTUS) 100 unit/mL SOPN Inject 65 Units into the skin at bedtime.   Yes [provider]  Ipratropium-Albuterol (COMBIVENT RESPIMAT IN) Inhale 1 puff  into the lungs 4 (four) times daily.   Yes [provider]  loperamide (IMODIUM) 2 MG capsule Take 4 mg by mouth daily.    Yes [provider]  metFORMIN (GLUCOPHAGE) 1000 MG tablet Take 1,000 mg by mouth 2 (two) times daily with a meal.   Yes [provider]  metoprolol tartrate (LOPRESSOR) 25 MG tablet Take 1 tablet (25 mg total) by mouth 2 (two) times daily. 11/01/18  Yes Ghimire, Werner Lean, MD  modafinil (PROVIGIL) 100 MG tablet Take 100 mg by mouth 3 (three) times daily.   Yes [provider]  Multiple Vitamins-Minerals (CENTRUM SILVER ADULT 50+ PO) Take 1 tablet by mouth daily.   Yes [provider]  NON FORMULARY CPAP Machine daily   Yes [provider]  Omega-3 Fatty Acids (FISH OIL) 1000 MG CAPS Take 1,000 mg by mouth daily.    Yes [provider]  omeprazole (PRILOSEC) 20 MG capsule Take 20 mg by mouth daily.   Yes [provider]  OVER THE COUNTER MEDICATION Take 1 tablet by mouth daily. Tumeric curcumin   Yes [provider]  pramipexole (MIRAPEX) 0.5 MG tablet Take 50 mg by mouth at bedtime. 06/18/10  Yes [provider]  prazosin (MINIPRESS) 5 MG capsule Take 15 mg by mouth at bedtime. For nightmares and flashbacks   Yes [provider]  pregabalin (LYRICA) 300 MG capsule Take 300 mg by mouth 2 (two) times daily.   Yes [provider]  Probiotic Product (PROBIOTIC DAILY PO) Take 1 capsule by mouth daily.   Yes [provider]  amoxicillin-clavulanate (AUGMENTIN) 875-125 MG tablet Take 1 tablet by mouth See admin instructions. Bid x 7 days Patient not taking: Reported on 09/14/2021 09/06/21   [provider]  budesonide-formoterol (SYMBICORT) 160-4.5 MCG/ACT inhaler Inhale 2 puffs into the lungs 2 (two) times daily. Patient not taking: Reported on 09/14/2021 12/25/20   Leslye Peer, MD  HYDROcodone-acetaminophen (NORCO/VICODIN) 5-325 MG per tablet Take 2 tablets by mouth every 6 (six) hours as needed. Patient not taking: Reported on 09/14/2021 02/18/15   Tegeler, Canary Brim, MD  methylPREDNISolone (MEDROL DOSEPAK) 4 MG TBPK tablet Take 4 mg by mouth See admin instructions. 6,5,4,3,2,1 Patient not taking: Reported on 09/14/2021 09/06/21   [provider]    Allergies    Pregabalin and Statins  Review of Systems   Review of Systems  Respiratory:  Positive for cough, shortness of breath and wheezing.   All other systems reviewed and are negative.  Physical Exam Updated Vital Signs BP (!) 155/57   Pulse 79   Temp 98.7 F (37.1 C) (Oral)   Resp 12   SpO2 92%   Physical Exam Vitals and nursing note reviewed.  Constitutional:      Appearance: He is well-developed.  HENT:     Head: Normocephalic and atraumatic.     Mouth/Throat:      Mouth: Mucous membranes are moist.     Pharynx: Oropharynx is clear.  Eyes:     Extraocular Movements: Extraocular movements intact.     Pupils: Pupils are equal, round, and reactive to light.  Cardiovascular:     Rate and Rhythm: Normal rate and regular rhythm.  Pulmonary:     Effort: Tachypnea present.     Breath sounds: Wheezing present.  Abdominal:     General: Bowel sounds are normal.     Palpations: Abdomen is soft.  Musculoskeletal:        General:  Normal range of motion.     Cervical back: Normal range of motion and neck supple.  Skin:    General: Skin is warm.     Capillary Refill: Capillary refill takes less than 2 seconds.  Neurological:     General: No focal deficit present.     Mental Status: He is alert and oriented to person, place, and time.  Psychiatric:        Mood and Affect: Mood normal.        Behavior: Behavior normal.    ED Results / Procedures / Treatments   Labs (all labs ordered are listed, but only abnormal results are displayed) Labs Reviewed  CBC WITH DIFFERENTIAL/PLATELET - Abnormal; Notable for the following components:      Result Value   WBC 12.2 (*)    Neutro Abs 8.3 (*)    Basophils Absolute 0.2 (*)    Abs Immature Granulocytes 0.18 (*)    All other components within normal limits  BASIC METABOLIC PANEL - Abnormal; Notable for the following components:   Glucose, Bld 189 (*)    Calcium 8.7 (*)    All other components within normal limits  RESP PANEL BY RT-PCR (FLU A&B, COVID) ARPGX2  D-DIMER, QUANTITATIVE    EKG EKG Interpretation  Date/Time:  Friday September 13 2021 23:43:37 EST Ventricular Rate:  79 PR Interval:  190 QRS Duration: 90 QT Interval:  376 QTC Calculation: 431 R Axis:   -25 Text Interpretation: Normal sinus rhythm Normal ECG No significant change since last tracing Confirmed by Jacalyn Lefevre [phone]) on 09/14/2021 1:47:05 AM  Radiology DG Chest Portable 1 View  Result Date: 09/14/2021 CLINICAL DATA:   Shortness of breath. EXAM: PORTABLE CHEST 1 VIEW COMPARISON:  10/30/2018. FINDINGS: The heart is enlarged and the mediastinal structures are within normal limits. No consolidation, effusion, or pneumothorax. Degenerative changes are present in the thoracic spine. No acute osseous abnormality. IMPRESSION: Cardiomegaly with no acute process. Electronically Signed   By: Thornell Sartorius M.D.   On: 09/14/2021 01:37    Procedures Procedures   Medications Ordered in ED Medications  albuterol (PROVENTIL) (2.5 MG/3ML) 0.083% nebulizer solution (10 mg/hr Nebulization New Bag/Given 09/14/21 0132)  ipratropium-albuterol (DUONEB) 0.5-2.5 (3) MG/3ML nebulizer solution 3 mL (3 mLs Nebulization Given 09/14/21 0017)  magnesium sulfate IVPB 2 g 50 mL (0 g Intravenous Stopped 09/14/21 0221)  methylPREDNISolone sodium succinate (SOLU-MEDROL) 125 mg/2 mL injection 125 mg (125 mg Intravenous Given 09/14/21 0108)    ED Course  I have reviewed the triage vital signs and the nursing notes.  Pertinent labs & imaging results that were available during my care of the patient were reviewed by me and considered in my medical decision making (see chart for details).    MDM Rules/Calculators/A&P                           Pt is still sob, very wheezy and lungs are tight after a continuous neb + solumedrol + mg.  He has failed outpatient tx.  He will need to come in.  Pt d/w Dr. Loney Loh ([facility]) for admission.  Covid/flu neg. Final Clinical Impression(s) / ED Diagnoses Final diagnoses:  COPD exacerbation [facility])    Rx / DC Orders ED Discharge Orders     None        Jacalyn Lefevre, MD 09/14/21 4656    Jacalyn Lefevre, MD 09/14/21 [phone]

## 2021-09-14 NOTE — Progress Notes (Addendum)
PROGRESS NOTE    Vincent Richmond  ZOX:[id number] DOB: [birth year] DOA: 09/13/2021 [facility]CP: [facility]    Chief Complaint  Patient presents with   Shortness of Breath    Brief Narrative:  Vincent Richmond is a 75 y.o. male with medical history significant of asthma-COPD overlap syndrome, OSA on CPAP, insulin-dependent type 2 diabetes, hypertension, stroke presented to the ED with a chief complaint of shortness of breath.  Wheezing on exam.  Not febrile or hypoxic.  WBC 12.2.  . Patient was given DuoNeb, IV Solu-Medrol 125 mg, albuterol continuous neb, and IV magnesium 2 g.   Patient states for over a week he is having difficulty and is wheezing.  He is also coughing a lot and today started coughing up blood.  He was seen by his PCP at the [place] a week ago and prescribed Tessalon Perles, Augmentin, Medrol Dosepak, and albuterol.  He is taking all of these medications but not improving.  Also reports subjective fevers.  Denies chest pain.  No other complaints.  Subjective:  Continue to have significant cough and wheezing Reports mild ankle edema, socks are tight  No fever, No hypoxia, denies chest pain  Assessment & Plan:   Principal Problem:   COPD exacerbation (HCC) Active Problems:   Diabetes mellitus without complication (HCC)   HTN (hypertension)   OSA (obstructive sleep apnea)   Leukocytosis   Acute exacerbation of asthma/COPD overlap syndrome -Failed outpatient treatment -COVID and influenza PCR negative.  D-dimer within normal range.  Chest x-ray negative for acute process -significant cough and wheezing, tachypnea  -Continue IV Solu-Medrol, DuoNeb, mucinex, add zithromax , give iv magx1 -add on sputum culture, check urine strep pneumo antigen   Cardiomegaly on oneview cxr Denies chest pain, does not appear significant volume overload Monitor   insulin-dependent type 2 diabetes with hyperglycemia A1c 7.8 Hold metformin while in hospital Need to adjust insulin  while on steroid  Hypertension Continue Lopressor Add as needed hydralazine       Unresulted Labs (From admission, onward)    None         DVT prophylaxis: Place and maintain sequential compression device Start: 09/14/21 0405   Code Status:full Family Communication: patient  Disposition:   Status is: Observation   Dispo: The patient is from: home              Anticipated d/c is to: home               Anticipated d/c date is: pending respiratory status                 Consultants:  none  Procedures:  none  Antimicrobials:    Anti-infectives (From admission, onward)    Start     Dose/Rate Route Frequency Ordered Stop   09/14/21 1415  azithromycin (ZITHROMAX) 500 mg in sodium chloride 0.9 % 250 mL IVPB        500 mg 250 mL/hr over 60 Minutes Intravenous Every 24 hours 09/14/21 1411 09/19/21 1414          Objective: Vitals:   09/14/21 0530 09/14/21 0545 09/14/21 0600 09/14/21 0700  BP: (!) 161/59 (!) 163/59 (!) 167/61 (!) 174/62  Pulse: 86 87 90 93  Resp: 14 18 19  (!) 23  Temp:      TempSrc:      SpO2: 93% 92% 94% 93%   No intake or output data in the 24 hours ending 09/14/21 0805 There were no vitals filed for this  visit.  Examination:  General exam: significant coughing and wheezing, tachypnea, no accessory muscle use, aaox3 Respiratory system:  diffuse bilateral wheezing , tachypnea. Cardiovascular system:  RRR.  Gastrointestinal system: Abdomen is nondistended, soft and nontender.  Normal bowel sounds heard. Central nervous system: Alert and oriented. No focal neurological deficits. Extremities:  subtle bilateral ankle edema Skin: No rashes, lesions or ulcers Psychiatry: Judgement and insight appear normal. Mood & affect appropriate.     Data Reviewed: I have personally reviewed following labs and imaging studies  CBC: Recent Labs  Lab 09/14/21 0031  WBC 12.2*  NEUTROABS 8.3*  HGB 13.1  HCT 40.1  MCV 88.7  PLT 262    Basic  Metabolic Panel: Recent Labs  Lab 09/14/21 0031  NA 135  K 4.1  CL 100  CO2 26  GLUCOSE 189*  BUN 10  CREATININE 0.82  CALCIUM 8.7*    GFR: CrCl cannot be calculated (Unknown ideal weight.).  Liver Function Tests: No results for input(s): AST, ALT, ALKPHOS, BILITOT, PROT, ALBUMIN in the last 168 hours.  CBG: No results for input(s): GLUCAP in the last 168 hours.   Recent Results (from the past 240 hour(s))  Resp Panel by RT-PCR (Flu A&B, Covid) Nasopharyngeal Swab     Status: None   Collection Time: 09/14/21 12:14 AM   Specimen: Nasopharyngeal Swab; Nasopharyngeal(NP) swabs in vial transport medium  Result Value Ref Range Status   SARS Coronavirus 2 by RT PCR NEGATIVE NEGATIVE Final    Comment: (NOTE) SARS-CoV-2 target nucleic acids are NOT DETECTED.  The SARS-CoV-2 RNA is generally detectable in upper respiratory specimens during the acute phase of infection. The lowest concentration of SARS-CoV-2 viral copies this assay can detect is 138 copies/mL. A negative result does not preclude SARS-Cov-2 infection and should not be used as the sole basis for treatment or other patient management decisions. A negative result may occur with  improper specimen collection/handling, submission of specimen other than nasopharyngeal swab, presence of viral mutation(s) within the areas targeted by this assay, and inadequate number of viral copies(<138 copies/mL). A negative result must be combined with clinical observations, patient history, and epidemiological information. The expected result is Negative.  Fact Sheet for Patients:  BloggerCourse.com  Fact Sheet for Healthcare Providers:  SeriousBroker.it  This test is no t yet approved or cleared by the [place] FDA and  has been authorized for detection and/or diagnosis of SARS-CoV-2 by FDA under an Emergency Use Authorization (EUA). This EUA will remain  in effect  (meaning this test can be used) for the duration of the COVID-19 declaration under Section 564(b)(1) of the Act, 21 U.S.C.section 360bbb-3(b)(1), unless the authorization is terminated  or revoked sooner.       Influenza A by PCR NEGATIVE NEGATIVE Final   Influenza B by PCR NEGATIVE NEGATIVE Final    Comment: (NOTE) The Xpert Xpress SARS-CoV-2/FLU/RSV plus assay is intended as an aid in the diagnosis of influenza from Nasopharyngeal swab specimens and should not be used as a sole basis for treatment. Nasal washings and aspirates are unacceptable for Xpert Xpress SARS-CoV-2/FLU/RSV testing.  Fact Sheet for Patients: BloggerCourse.com  Fact Sheet for Healthcare Providers: SeriousBroker.it  This test is not yet approved or cleared by the [place] FDA and has been authorized for detection and/or diagnosis of SARS-CoV-2 by FDA under an Emergency Use Authorization (EUA). This EUA will remain in effect (meaning this test can be used) for the duration of the COVID-19 declaration under Section 564(b)(1) of  the Act, 21 U.S.C. section 360bbb-3(b)(1), unless the authorization is terminated or revoked.  Performed at [facility], [address] [place], [place] [address]          Radiology Studies: DG Chest Portable 1 View  Result Date: 09/14/2021 CLINICAL DATA:  Shortness of breath. EXAM: PORTABLE CHEST 1 VIEW COMPARISON:  10/30/2018. FINDINGS: The heart is enlarged and the mediastinal structures are within normal limits. No consolidation, effusion, or pneumothorax. Degenerative changes are present in the thoracic spine. No acute osseous abnormality. IMPRESSION: Cardiomegaly with no acute process. Electronically Signed   By: Thornell Sartorius M.D.   On: 09/14/2021 01:37        Scheduled Meds:  budesonide (PULMICORT) nebulizer solution  2 mg Nebulization Q12H   buPROPion  300 mg Oral Daily   dextromethorphan-guaiFENesin   1 tablet Oral BID   FLUoxetine  80 mg Oral Daily   insulin aspart  0-15 Units Subcutaneous TID WC   insulin aspart  0-5 Units Subcutaneous QHS   insulin glargine-yfgn  65 Units Subcutaneous QHS   ipratropium-albuterol  3 mL Nebulization Q6H   methylPREDNISolone (SOLU-MEDROL) injection  90 mg Intravenous Q12H   metoprolol tartrate  25 mg Oral BID   modafinil  100 mg Oral TID   pantoprazole  40 mg Oral Daily   prazosin  15 mg Oral QHS   pregabalin  300 mg Oral BID   Continuous Infusions:   LOS: 0 days   Time spent: Greater than 50% of this time was spent in counseling, explanation of diagnosis, planning of further management, and coordination of care.   Voice Recognition Reubin Milan dictation system was used to create this note, attempts have been made to correct errors. Please contact the author with questions and/or clarifications.   Albertine Grates, MD PhD FACP [facility]  Available via Epic secure chat 7am-7pm for nonurgent issues Please page for urgent issues To page the attending provider between 7A-7P or the covering provider during after hours 7P-7A, please log into the web site [email or web address] and access using universal [place] password for that web site. If you do not have the password, please call the hospital operator.    09/14/2021, 8:05 AM

## 2021-09-14 NOTE — Progress Notes (Signed)
Patient declined CPAP use at this time. Frequent strong, productive cough. Patient aware to call for Respiratory if CPAP desired.

## 2021-09-14 NOTE — ED Triage Notes (Signed)
Pt from home with increasing shob.  Audible wheezes.  Pt seen at [facility] last Friday and finished his rx from there yesterday but has gotten worse, not better.

## 2021-09-14 NOTE — ED Notes (Signed)
Pt ambulated to restroom, with standby assistance.

## 2021-09-14 NOTE — Progress Notes (Signed)
Patient arrived to unit. VSS. Ambulated to BR. Denies pain. Tolerating diet. Nonproductive cough. No complaints at this time.

## 2021-09-14 NOTE — ED Provider Notes (Signed)
Emergency Medicine Provider Triage Evaluation Note  Brynden Thune , a 75 y.o. male  was evaluated in triage.  Pt complains of SOB x 1 week. Seen at the [place] 1 week ago and put on Tessalon, methylprednisolone, Augmentin, and albuterol for symptoms, but has continued to have symptomatic worsening. Audible wheezing at bedside. Has an associated cough with onset of hemoptysis today. No fevers, unilateral leg swelling, syncope. Wife endorses similar hospitalization in 2020.   Review of Systems  Positive: As above Negative: As above  Physical Exam  BP (!) 157/73 (BP Location: Right Arm)   Pulse 80   Temp 98.7 F (37.1 C) (Oral)   Resp (!) 22   SpO2 93%  Gen:   Awake, mild distress Resp:  Dyspnea, tachypnea, diffuse expiratory wheezing. SpO2 90-92% on room air. MSK:   Moves extremities without difficulty  Other:  Trace BLE edema  Medical Decision Making  Medically screening exam initiated at 12:13 AM.  Appropriate orders placed.  Aydn Ferrara was informed that the remainder of the evaluation will be completed by another provider, this initial triage assessment does not replace that evaluation, and the importance of remaining in the ED until their evaluation is complete.  SOB - started on Duoneb. Will need IV solumedrol, MgSO4. Pending labs, CXR, EKG.   Antony Madura, PA-C 09/14/21 0017    Jacalyn Lefevre, MD 09/14/21 0030

## 2021-09-14 NOTE — ED Notes (Signed)
Pt ambulatory with 1 assist. Initially unsteady upon standing however steady gait noted after standing for a few minutes.

## 2021-09-15 LAB — CBC WITH DIFFERENTIAL/PLATELET
Abs Immature Granulocytes: 0.3 10*3/uL — ABNORMAL HIGH (ref 0.00–0.07)
Basophils Absolute: 0.1 10*3/uL (ref 0.0–0.1)
Basophils Relative: 0 %
Eosinophils Absolute: 0.1 10*3/uL (ref 0.0–0.5)
Eosinophils Relative: 1 %
HCT: 37.7 % — ABNORMAL LOW (ref 39.0–52.0)
Hemoglobin: 12.7 g/dL — ABNORMAL LOW (ref 13.0–17.0)
Immature Granulocytes: 2 %
Lymphocytes Relative: 11 %
Lymphs Abs: 1.9 10*3/uL (ref 0.7–4.0)
MCH: 29.6 pg (ref 26.0–34.0)
MCHC: 33.7 g/dL (ref 30.0–36.0)
MCV: 87.9 fL (ref 80.0–100.0)
Monocytes Absolute: 1.2 10*3/uL — ABNORMAL HIGH (ref 0.1–1.0)
Monocytes Relative: 7 %
Neutro Abs: 14.1 10*3/uL — ABNORMAL HIGH (ref 1.7–7.7)
Neutrophils Relative %: 79 %
Platelets: 248 10*3/uL (ref 150–400)
RBC: 4.29 MIL/uL (ref 4.22–5.81)
RDW: 14.1 % (ref 11.5–15.5)
WBC: 17.7 10*3/uL — ABNORMAL HIGH (ref 4.0–10.5)
nRBC: 0 % (ref 0.0–0.2)

## 2021-09-15 LAB — MAGNESIUM: Magnesium: 2.6 mg/dL — ABNORMAL HIGH (ref 1.7–2.4)

## 2021-09-15 LAB — COMPREHENSIVE METABOLIC PANEL
ALT: 18 U/L (ref 0–44)
AST: 18 U/L (ref 15–41)
Albumin: 3.6 g/dL (ref 3.5–5.0)
Alkaline Phosphatase: 60 U/L (ref 38–126)
Anion gap: 10 (ref 5–15)
BUN: 16 mg/dL (ref 8–23)
CO2: 24 mmol/L (ref 22–32)
Calcium: 8.5 mg/dL — ABNORMAL LOW (ref 8.9–10.3)
Chloride: 102 mmol/L (ref 98–111)
Creatinine, Ser: 0.92 mg/dL (ref 0.61–1.24)
GFR, Estimated: >60 mL/min (ref >60)
Glucose, Bld: 255 mg/dL — ABNORMAL HIGH (ref 70–99)
Potassium: 4.4 mmol/L (ref 3.5–5.1)
Sodium: 136 mmol/L (ref 135–145)
Total Bilirubin: 0.4 mg/dL (ref 0.3–1.2)
Total Protein: 6 g/dL — ABNORMAL LOW (ref 6.5–8.1)

## 2021-09-15 LAB — GLUCOSE, CAPILLARY
Glucose-Capillary: 279 mg/dL — ABNORMAL HIGH (ref 70–99)
Glucose-Capillary: 328 mg/dL — ABNORMAL HIGH (ref 70–99)
Glucose-Capillary: 275 mg/dL — ABNORMAL HIGH (ref 70–99)
Glucose-Capillary: 171 mg/dL — ABNORMAL HIGH (ref 70–99)

## 2021-09-15 LAB — STREP PNEUMONIAE URINARY ANTIGEN: Strep Pneumo Urinary Antigen: NEGATIVE

## 2021-09-15 MED ORDER — METHYLPREDNISOLONE SODIUM SUCC 125 MG IJ SOLR
90.0000 mg | Freq: Two times a day (BID) | INTRAMUSCULAR | Status: DC
  Administered 2021-09-15 – 2021-09-18 (×6): 90 mg via INTRAVENOUS
  Filled 2021-09-15 (×6): qty 2

## 2021-09-15 MED ORDER — IPRATROPIUM-ALBUTEROL 0.5-2.5 (3) MG/3ML IN SOLN
3.0000 mL | RESPIRATORY_TRACT | Status: DC
  Administered 2021-09-15 – 2021-09-16 (×10): 3 mL via RESPIRATORY_TRACT
  Filled 2021-09-15 (×10): qty 3

## 2021-09-15 MED ORDER — INSULIN GLARGINE-YFGN 100 UNIT/ML ~~LOC~~ SOLN
70.0000 [IU] | Freq: Every day | SUBCUTANEOUS | Status: DC
Start: 2021-09-15 — End: 2021-09-16
  Administered 2021-09-15: 70 [IU] via SUBCUTANEOUS
  Filled 2021-09-15 (×4): qty 0.7

## 2021-09-15 MED ORDER — INSULIN ASPART 100 UNIT/ML IJ SOLN
5.0000 [IU] | Freq: Three times a day (TID) | INTRAMUSCULAR | Status: DC
  Administered 2021-09-15 – 2021-09-16 (×3): 5 [IU] via SUBCUTANEOUS

## 2021-09-15 NOTE — Progress Notes (Signed)
Patient declined CPAP at this time.  Patient aware to call RT if he changes his mind.

## 2021-09-15 NOTE — Progress Notes (Signed)
RN placed hat in patient's bathroom to obtain urine sample needed from yesterday. Also gave patient sputum container and educated on how to use. Gave incentive spirometer and educated about use, patient agreeable to use.

## 2021-09-15 NOTE — Progress Notes (Signed)
PROGRESS NOTE    Vincent Richmond  WUJ:[id number] DOB: Jun 30, 1946 DOA: 09/13/2021 [facility]: [facility]    Chief Complaint  Patient presents with   Shortness of Breath    Brief Narrative:  Vincent Richmond is a 75 y.o. male with medical history significant of asthma-COPD overlap syndrome, OSA on CPAP, insulin-dependent type 2 diabetes, hypertension, stroke presented to the ED with a chief complaint of shortness of breath.  Wheezing on exam.  Not febrile or hypoxic.  WBC 12.2.  . Patient was given DuoNeb, IV Solu-Medrol 125 mg, albuterol continuous neb, and IV magnesium 2 g.   Patient states for over a week he is having difficulty and is wheezing.  He is also coughing a lot and today started coughing up blood.  He was seen by his PCP at the [place] a week ago and prescribed Tessalon Perles, Augmentin, Medrol Dosepak, and albuterol.  He is taking all of these medications but not improving.  Also reports subjective fevers.  Denies chest pain.  No other complaints.  Subjective:  o2 dropped to 87% last night, he was put on oxygen supplement Continue to have significant cough and wheezing  No fever, No hypoxia, denies chest pain  Wife at bedside   Assessment & Plan:   Principal Problem:   COPD exacerbation (HCC) Active Problems:   Diabetes mellitus without complication (HCC)   HTN (hypertension)   OSA (obstructive sleep apnea)   Leukocytosis   COPD (chronic obstructive pulmonary disease) (HCC)   Acute hypoxic respiratory failure/Acute exacerbation of asthma/COPD overlap syndrome -Failed outpatient treatment -COVID and influenza PCR negative.  D-dimer within normal range.  Chest x-ray negative for acute process -significant cough and wheezing, tachypnea  -Continue IV Solu-Medrol, DuoNeb, mucinex,  zithromax ,, s/p  iv magx2 -sputum culture/ urine strep pneumo antigen ordered, pending collection  -02 dropped to 87% last night, he was put on oxygen supplement -wean oxygen as  able  Cardiomegaly on one view cxr Denies chest pain, does not appear significant volume overload Monitor   insulin-dependent type 2 diabetes with hyperglycemia A1c 7.8 Hold metformin while in hospital Steroids induced hyperglycemia, increase long acting insulin, add meal coverage, continue ssi  Hypertension Continue Lopressor Add as needed hydralazine  Obesity  Body mass index is 38.34 kg/m. Will benefit from outpatient sleep study        Unresulted Labs (From admission, onward)     Start     Ordered   09/14/21 1413  Strep pneumoniae urinary antigen  Once,   R        09/14/21 1412   09/14/21 1412  Expectorated Sputum Assessment w Gram Stain, Rflx to Resp Cult  Once,   R        09/14/21 1411              DVT prophylaxis: Place and maintain sequential compression device Start: 09/14/21 0405   Code Status:full Family Communication: wife at bedside  Disposition:   Status is: inpatient    Dispo: The patient is from: home              Anticipated d/c is to: home               Anticipated d/c date is: pending respiratory status                 Consultants:  none  Procedures:  none  Antimicrobials:    Anti-infectives (From admission, onward)    Start     Dose/Rate  Route Frequency Ordered Stop   09/14/21 1500  azithromycin (ZITHROMAX) 500 mg in sodium chloride 0.9 % 250 mL IVPB        500 mg 250 mL/hr over 60 Minutes Intravenous Every 24 hours 09/14/21 1411 09/19/21 1459          Objective: Vitals:   09/15/21 0252 09/15/21 0514 09/15/21 0743 09/15/21 0800  BP:  (!) 116/59  (!) 158/73  Pulse:  76 73 77  Resp:  18 20 16   Temp:  97.6 F (36.4 C)  97.6 F (36.4 C)  TempSrc:    Oral  SpO2: (!) 87% 96% 93% 95%  Weight:      Height:        Intake/Output Summary (Last 24 hours) at 09/15/2021 1258 Last data filed at 09/15/2021 1255 Gross per 24 hour  Intake 620 ml  Output 751 ml  Net -131 ml   Filed Weights   09/14/21 1641  Weight:  124.7 kg    Examination:  General exam: no accessory muscle use, aaox3 Respiratory system:  diffuse bilateral wheezing , less tachypnea. Cardiovascular system:  RRR.  Gastrointestinal system: Abdomen is nondistended, soft and nontender.  Normal bowel sounds heard. Central nervous system: Alert and oriented. No focal neurological deficits. Extremities:  subtle bilateral ankle edema seen on 12/10 appear has resolved  Skin: No rashes, lesions or ulcers Psychiatry: Judgement and insight appear normal. Mood & affect appropriate.     Data Reviewed: I have personally reviewed following labs and imaging studies  CBC: Recent Labs  Lab 09/14/21 0031 09/15/21 0132  WBC 12.2* 17.7*  NEUTROABS 8.3* 14.1*  HGB 13.1 12.7*  HCT 40.1 37.7*  MCV 88.7 87.9  PLT 262 248    Basic Metabolic Panel: Recent Labs  Lab 09/14/21 0031 09/15/21 0132  NA 135 136  K 4.1 4.4  CL 100 102  CO2 26 24  GLUCOSE 189* 255*  BUN 10 16  CREATININE 0.82 0.92  CALCIUM 8.7* 8.5*  MG  --  2.6*    GFR: Estimated Creatinine Clearance: 91.9 mL/min (by C-G formula based on SCr of 0.92 mg/dL).  Liver Function Tests: Recent Labs  Lab 09/15/21 0132  AST 18  ALT 18  ALKPHOS 60  BILITOT 0.4  PROT 6.0*  ALBUMIN 3.6    CBG: Recent Labs  Lab 09/14/21 1244 09/14/21 1659 09/14/21 2142 09/15/21 0858 09/15/21 1121  GLUCAP 229* 281* 200* 279* 328*     Recent Results (from the past 240 hour(s))  Resp Panel by RT-PCR (Flu A&B, Covid) Nasopharyngeal Swab     Status: None   Collection Time: 09/14/21 12:14 AM   Specimen: Nasopharyngeal Swab; Nasopharyngeal(NP) swabs in vial transport medium  Result Value Ref Range Status   SARS Coronavirus 2 by RT PCR NEGATIVE NEGATIVE Final    Comment: (NOTE) SARS-CoV-2 target nucleic acids are NOT DETECTED.  The SARS-CoV-2 RNA is generally detectable in upper respiratory specimens during the acute phase of infection. The lowest concentration of SARS-CoV-2 viral  copies this assay can detect is 138 copies/mL. A negative result does not preclude SARS-Cov-2 infection and should not be used as the sole basis for treatment or other patient management decisions. A negative result may occur with  improper specimen collection/handling, submission of specimen other than nasopharyngeal swab, presence of viral mutation(s) within the areas targeted by this assay, and inadequate number of viral copies(<138 copies/mL). A negative result must be combined with clinical observations, patient history, and epidemiological information. The expected result is  Negative.  Fact Sheet for Patients:  BloggerCourse.com  Fact Sheet for Healthcare Providers:  SeriousBroker.it  This test is no t yet approved or cleared by the [place] FDA and  has been authorized for detection and/or diagnosis of SARS-CoV-2 by FDA under an Emergency Use Authorization (EUA). This EUA will remain  in effect (meaning this test can be used) for the duration of the COVID-19 declaration under Section 564(b)(1) of the Act, 21 U.S.C.section 360bbb-3(b)(1), unless the authorization is terminated  or revoked sooner.       Influenza A by PCR NEGATIVE NEGATIVE Final   Influenza B by PCR NEGATIVE NEGATIVE Final    Comment: (NOTE) The Xpert Xpress SARS-CoV-2/FLU/RSV plus assay is intended as an aid in the diagnosis of influenza from Nasopharyngeal swab specimens and should not be used as a sole basis for treatment. Nasal washings and aspirates are unacceptable for Xpert Xpress SARS-CoV-2/FLU/RSV testing.  Fact Sheet for Patients: BloggerCourse.com  Fact Sheet for Healthcare Providers: SeriousBroker.it  This test is not yet approved or cleared by the [place] FDA and has been authorized for detection and/or diagnosis of SARS-CoV-2 by FDA under an Emergency Use Authorization (EUA). This  EUA will remain in effect (meaning this test can be used) for the duration of the COVID-19 declaration under Section 564(b)(1) of the Act, 21 U.S.C. section 360bbb-3(b)(1), unless the authorization is terminated or revoked.  Performed at [facility], [address] [place], [place] [address]          Radiology Studies: DG Chest Portable 1 View  Result Date: 09/14/2021 CLINICAL DATA:  Shortness of breath. EXAM: PORTABLE CHEST 1 VIEW COMPARISON:  10/30/2018. FINDINGS: The heart is enlarged and the mediastinal structures are within normal limits. No consolidation, effusion, or pneumothorax. Degenerative changes are present in the thoracic spine. No acute osseous abnormality. IMPRESSION: Cardiomegaly with no acute process. Electronically Signed   By: Thornell Sartorius M.D.   On: 09/14/2021 01:37        Scheduled Meds:  budesonide (PULMICORT) nebulizer solution  2 mg Nebulization Q12H   buPROPion  300 mg Oral Daily   dextromethorphan-guaiFENesin  1 tablet Oral BID   FLUoxetine  80 mg Oral Daily   insulin aspart  0-15 Units Subcutaneous TID WC   insulin aspart  0-5 Units Subcutaneous QHS   insulin aspart  5 Units Subcutaneous TID WC   insulin glargine-yfgn  70 Units Subcutaneous QHS   ipratropium-albuterol  3 mL Nebulization Q4H   methylPREDNISolone (SOLU-MEDROL) injection  90 mg Intravenous Q12H   metoprolol tartrate  25 mg Oral BID   modafinil  100 mg Oral TID   pantoprazole  40 mg Oral Daily   prazosin  15 mg Oral QHS   pregabalin  300 mg Oral BID   Continuous Infusions:  azithromycin Stopped (09/14/21 1700)     LOS: 1 day   Time spent: Greater than 50% of this time was spent in counseling, explanation of diagnosis, planning of further management, and coordination of care.   Voice Recognition Reubin Milan dictation system was used to create this note, attempts have been made to correct errors. Please contact the author with questions and/or  clarifications.   Albertine Grates, MD PhD FACP [facility]  Available via Epic secure chat 7am-7pm for nonurgent issues Please page for urgent issues To page the attending provider between 7A-7P or the covering provider during after hours 7P-7A, please log into the web site [email or web address] and access using universal [place]  password for that web site. If you do not have the password, please call the hospital operator.    09/15/2021, 12:58 PM

## 2021-09-15 NOTE — Progress Notes (Signed)
Mobility Specialist Progress Note:   09/15/21 1420  Mobility  Activity Ambulated in hall  Level of Assistance Standby assist, set-up cues, supervision of patient - no hands on  Assistive Device Front wheel walker  Distance Ambulated (ft) 570 ft  Mobility Ambulated with assistance in hallway  Mobility Response Tolerated well  Mobility performed by Mobility specialist  $Mobility charge 1 Mobility   Attempted ambulation with no AD, which is baseline. Pt with multiple LOBs before reaching door. RW used for remainder of session. Pt with SOB at end of session, back in bed with bed alarm on.   Addison Lank Mobility Specialist  Phone [phone]

## 2021-09-15 NOTE — Progress Notes (Signed)
MD notified of 328 blood sugar

## 2021-09-15 NOTE — Plan of Care (Signed)
  Problem: Activity: Goal: Risk for activity intolerance will decrease Outcome: Progressing   Problem: Nutrition: Goal: Adequate nutrition will be maintained Outcome: Progressing   Problem: Safety: Goal: Ability to remain free from injury will improve Outcome: Progressing   

## 2021-09-16 LAB — BASIC METABOLIC PANEL
Anion gap: 10 (ref 5–15)
BUN: 18 mg/dL (ref 8–23)
CO2: 23 mmol/L (ref 22–32)
Calcium: 8.3 mg/dL — ABNORMAL LOW (ref 8.9–10.3)
Chloride: 102 mmol/L (ref 98–111)
Creatinine, Ser: 0.89 mg/dL (ref 0.61–1.24)
GFR, Estimated: >60 mL/min (ref >60)
Glucose, Bld: 215 mg/dL — ABNORMAL HIGH (ref 70–99)
Potassium: 4.3 mmol/L (ref 3.5–5.1)
Sodium: 135 mmol/L (ref 135–145)

## 2021-09-16 LAB — GLUCOSE, CAPILLARY
Glucose-Capillary: 295 mg/dL — ABNORMAL HIGH (ref 70–99)
Glucose-Capillary: 246 mg/dL — ABNORMAL HIGH (ref 70–99)
Glucose-Capillary: 207 mg/dL — ABNORMAL HIGH (ref 70–99)
Glucose-Capillary: 277 mg/dL — ABNORMAL HIGH (ref 70–99)

## 2021-09-16 LAB — CBC
HCT: 39.2 % (ref 39.0–52.0)
Hemoglobin: 12.9 g/dL — ABNORMAL LOW (ref 13.0–17.0)
MCH: 29.2 pg (ref 26.0–34.0)
MCHC: 32.9 g/dL (ref 30.0–36.0)
MCV: 88.7 fL (ref 80.0–100.0)
Platelets: 279 10*3/uL (ref 150–400)
RBC: 4.42 MIL/uL (ref 4.22–5.81)
RDW: 14.1 % (ref 11.5–15.5)
WBC: 15.2 10*3/uL — ABNORMAL HIGH (ref 4.0–10.5)
nRBC: 0 % (ref 0.0–0.2)

## 2021-09-16 MED ORDER — FLUTICASONE FUROATE-VILANTEROL 200-25 MCG/ACT IN AEPB: 1.0000 | INHALATION_SPRAY | Freq: Every day | RESPIRATORY_TRACT | Status: DC

## 2021-09-16 MED ORDER — IPRATROPIUM-ALBUTEROL 0.5-2.5 (3) MG/3ML IN SOLN
3.0000 mL | Freq: Four times a day (QID) | RESPIRATORY_TRACT | Status: DC
  Administered 2021-09-17: 3 mL via RESPIRATORY_TRACT
  Filled 2021-09-16: qty 3

## 2021-09-16 MED ORDER — PRAMIPEXOLE DIHYDROCHLORIDE 0.125 MG PO TABS
0.1250 mg | ORAL_TABLET | Freq: Every day | ORAL | Status: DC
  Administered 2021-09-16 – 2021-09-17 (×2): 0.125 mg via ORAL
  Filled 2021-09-16 (×3): qty 1

## 2021-09-16 MED ORDER — ENOXAPARIN SODIUM 40 MG/0.4ML IJ SOSY
40.0000 mg | PREFILLED_SYRINGE | INTRAMUSCULAR | Status: DC
  Administered 2021-09-16 – 2021-09-18 (×3): 40 mg via SUBCUTANEOUS
  Filled 2021-09-16 (×3): qty 0.4

## 2021-09-16 MED ORDER — INSULIN GLARGINE-YFGN 100 UNIT/ML ~~LOC~~ SOLN
75.0000 [IU] | Freq: Every day | SUBCUTANEOUS | Status: DC
  Administered 2021-09-16: 75 [IU] via SUBCUTANEOUS
  Filled 2021-09-16 (×2): qty 0.75

## 2021-09-16 MED ORDER — VITAMIN D 25 MCG (1000 UNIT) PO TABS
1000.0000 [IU] | ORAL_TABLET | Freq: Every day | ORAL | Status: DC
  Administered 2021-09-16 – 2021-09-18 (×3): 1000 [IU] via ORAL
  Filled 2021-09-16 (×3): qty 1

## 2021-09-16 MED ORDER — BENZONATATE 100 MG PO CAPS: 100.0000 mg | ORAL_CAPSULE | Freq: Three times a day (TID) | ORAL | Status: DC | PRN

## 2021-09-16 MED ORDER — AZITHROMYCIN 500 MG PO TABS
500.0000 mg | ORAL_TABLET | Freq: Every evening | ORAL | Status: DC
  Administered 2021-09-16 – 2021-09-17 (×2): 500 mg via ORAL
  Filled 2021-09-16 (×3): qty 1

## 2021-09-16 MED ORDER — INSULIN ASPART 100 UNIT/ML IJ SOLN
8.0000 [IU] | Freq: Three times a day (TID) | INTRAMUSCULAR | Status: DC
Start: 2021-09-16 — End: 2021-09-17
  Administered 2021-09-16 – 2021-09-17 (×2): 8 [IU] via SUBCUTANEOUS

## 2021-09-16 NOTE — Plan of Care (Signed)
  Problem: Education: Goal: Knowledge of General Education information will improve Description: Including pain rating scale, medication(s)/side effects and non-pharmacologic comfort measures Outcome: Progressing   Problem: Health Behavior/Discharge Planning: Goal: Ability to manage health-related needs will improve Outcome: Progressing   Problem: Clinical Measurements: Goal: Ability to maintain clinical measurements within normal limits will improve Outcome: Progressing Goal: Will remain free from infection Outcome: Progressing Goal: Diagnostic test results will improve Outcome: Progressing Goal: Respiratory complications will improve Outcome: Progressing Goal: Cardiovascular complication will be avoided Outcome: Progressing   Problem: Elimination: Goal: Will not experience complications related to bowel motility Outcome: Progressing Goal: Will not experience complications related to urinary retention Outcome: Progressing   Problem: Pain Managment: Goal: General experience of comfort will improve Outcome: Progressing   Problem: Safety: Goal: Ability to remain free from injury will improve Outcome: Progressing   

## 2021-09-16 NOTE — Progress Notes (Signed)
  Mobility Specialist Criteria Algorithm Info.   09/16/21 1500  Mobility  Activity Ambulated in hall  Range of Motion/Exercises Active;All extremities  Level of Assistance Standby assist, set-up cues, supervision of patient - no hands on  Assistive Device Front wheel walker  Distance Ambulated (ft) 720 ft  Mobility Ambulated with assistance in hallway  Mobility Response Tolerated well  Mobility performed by Mobility specialist  Bed Position Semi-fowlers   Patient received in bed asleep, eager to participate in mobility. Ambulated in hallway supervision level with slow steady gait. Required standing rest breaks x2 and had multiple coughing spells throughout. Oxygen saturated well on room air. Returned to room without complaint or incident. Was left with all needs met and RT present.   09/16/2021 4:28 PM

## 2021-09-16 NOTE — Progress Notes (Signed)
Pt refused CPAP tonight.

## 2021-09-16 NOTE — Progress Notes (Signed)
Inpatient Diabetes Program Recommendations  AACE/ADA: New Consensus Statement on Inpatient Glycemic Control   Target Ranges:  Prepandial:   less than 140 mg/dL      Peak postprandial:   less than 180 mg/dL (1-2 hours)      Critically ill patients:  140 - 180 mg/dL    Latest Reference Range & Units 09/15/21 08:58 09/15/21 11:21 09/15/21 16:20 09/15/21 20:36 09/16/21 09:10  Glucose-Capillary 70 - 99 mg/dL 885 (H) 027 (H) 741 (H) 171 (H) 295 (H)   Review of Glycemic Control  Diabetes history: DM2 Outpatient Diabetes medications: Metformin 1000 mg BID (only DM med on home med list), per [place] office note on 09/06/21, has Semglee 60 units QHS, Glipizide 10 mg BID, Ozempic 1 mg Qweek, and Metformin 1000 mg BID listed as DM meds Current orders for Inpatient glycemic control: Semglee 70 units QHS, Novolog 5 units TID with meals, Novolog 0-15 units TID with meals, Novolog 0-5 units QHS; Solumedrol 90 mg Q12H  Inpatient Diabetes Program Recommendations:    Insulin: If steroids are continued as ordered, please consider increasing Semglee to 75 units QHS and meal coverage to Novolog 8 units TID with meals.  Thanks, Orlando Penner, RN, MSN, CDE Diabetes Coordinator Inpatient Diabetes Program [phone] (Team Pager from 8am to 5pm)

## 2021-09-16 NOTE — TOC Initial Note (Signed)
Transition of Care [facility]) - Initial/Assessment Note    Patient Details  Name: Vincent Richmond MRN: [id number] Date of Birth: [birth year]  Transition of Care [facility]) CM/SW Contact:    Epifanio Lesches, RN Phone Number: 09/16/2021, 10:11 AM  Clinical Narrative:                   Transition of Care [facility]) Screening Note   Patient Details  Name: Vincent Richmond Date of Birth: 1946-01-29   [facility]) CM/SW Contact:    Epifanio Lesches, RN Phone Number: 09/16/2021, 10:12 AM  Presents with SOB/ COPD exacerbation. From home with wife. States independent with ADL's PTA. Owns a cane. Active with [facility]. Can't remember PCP's name. Per patient [facility] was [facility] of [facility] hospital stay. NCM called April/ [place] coordinator and made her aware, voice message left. April to return call with pt's PCP and SW contact information..... Transition of Care Department [facility]) has reviewed patient and will continue to monitor and assist with needs....   Expected Discharge Plan: Home/Self Care Barriers to Discharge: Continued Medical Work up   Patient Goals and CMS Choice        Expected Discharge Plan and Services Expected Discharge Plan: Home/Self Care   Discharge Planning Services: CM Consult   Living arrangements for the past 2 months: Single Family Home                                      Prior Living Arrangements/Services Living arrangements for the past 2 months: Single Family Home Lives with:: Spouse Patient language and need for interpreter reviewed:: Yes Do you feel safe going back to the place where you live?: Yes      Need for Family Participation in Patient Care: Yes (Comment) Care giver support system in place?: Yes (comment) Current home services: DME ([facility]) Criminal Activity/Legal Involvement Pertinent to Current Situation/Hospitalization: No - Comment as needed  Activities of Daily Living Home Assistive  Devices/Equipment: Cane (specify quad or straight), Walker (specify type) ADL Screening (condition at time of admission) Patient's cognitive ability adequate to safely complete daily activities?: Yes Is the patient deaf or have difficulty hearing?: No Does the patient have difficulty seeing, even when wearing glasses/contacts?: No Does the patient have difficulty concentrating, remembering, or making decisions?: No Patient able to express need for assistance with ADLs?: Yes Does the patient have difficulty dressing or bathing?: No Independently performs ADLs?: Yes (appropriate for developmental age) Does the patient have difficulty walking or climbing stairs?: Yes Weakness of Legs: Both Weakness of Arms/Hands: Both  Permission Sought/Granted   Permission granted to share information with : Yes, Verbal Permission Granted  Share Information with NAME: Ranen Doolin (Spouse)  [phone]           Emotional Assessment Appearance:: Appears stated age Attitude/Demeanor/Rapport: Gracious Affect (typically observed): Accepting Orientation: : Oriented to Self, Oriented to Place, Oriented to  Time, Oriented to Situation Alcohol / Substance Use: Not Applicable Psych Involvement: No (comment)  Admission diagnosis:  COPD exacerbation (HCC) [J44.1] COPD (chronic obstructive pulmonary disease) (HCC) [J44.9] Patient Active Problem List   Diagnosis Date Noted   COPD exacerbation (HCC) 09/14/2021   Leukocytosis 09/14/2021   COPD (chronic obstructive pulmonary disease) (HCC) 09/14/2021   GERD (gastroesophageal reflux disease) 11/23/2019   Allergic rhinitis 07/25/2019   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 ([address]), [place] - [address] [place] ([place]) [place] [address] Phone: [phone] Fax: [phone]  [facility] - [place], [place] - [address] [place] [address] Phone: [phone] Fax: [phone]     Social Determinants of Health (SDOH) Interventions    Readmission Risk Interventions No flowsheet data found.

## 2021-09-16 NOTE — Progress Notes (Signed)
PROGRESS NOTE    Linton Stolp  YTK:[id number] DOB: Dec 28, 1945 DOA: 09/13/2021 PCP: [facility]   Chief Complaint  Patient presents with   Shortness of Breath   Brief Narrative/Hospital Course:  [name], 75 y.o. male with PMH of asthma-COPD overlap syndrome, OSA on CPAP, type 2 diabetes on insulin, hypertension, history of stroke presented to the ED with shortness of breath, wheezing, going on for a week also a lot of coughing.  Seen by his PCP a week ago given Tessalon Perle, Augmentin Medrol Dosepak and albuterol which patient has been taking without much improvement. He was seen in the ED, chest x-ray cardiomegaly no acute process, patient was found to have acute exacerbation of his asthma-COPD overlap syndrome leukocytosis and admitted for further management   Subjective: Seen and examined this morning Still co wheezxing dry cough. Shortness of breaht improving, able to speak full sentence  Assessment & Plan:  Acute asthma -COPD overlap syndrome with exacerbation: Patient overall feeling much improved but he still had significant wheezing, dry cough.  He is not needing oxygen.  COVID and influenza negative D-dimer normal chest x-ray no pneumonia.  Continue current systemic steroids bronchodilators Mucinex antibiotics. Monitor overnight additional 24 hours.  Type II diabetes mellitus on insulin A1c 7.8 blood sugar poorly controlled increase Lantus insulin to 70 units and Premeal to 15 units 3 times daily.  Likely worsened by the steroid use. Recent Labs  Lab 09/15/21 1121 09/15/21 1620 09/15/21 2036 09/16/21 0910 09/16/21 1120  GLUCAP 328* 275* 171* 295* 246*    Hypertension stable on Lopressor-given ongoing wheezing I will hold off on Lopressor Cardiomegaly on chest x-ray 1 view unreliable, no signs symptoms of volume overload.  Fu w/ pcp  OSA: Not using CPAP due to cough  Leukocytosis: Likely reactive downtrending afebrile.  Chest x-ray no  pneumonia.  Class II  Obesity:Patient's Body mass index is 38.34 kg/m. : Will benefit with PCP follow-up, weight loss  healthy lifestyle. Not using cpap now 2/2 cough  DVT prophylaxis: Place and maintain sequential compression device Start: 09/14/21 0405 Code Status:   Code Status: Full Code Family Communication: plan of care discussed with patient at bedside. Status is: Inpatient Remains inpatient appropriate because: Due to ongoing management of his exacerbation Disposition: Currently not medically stable for discharge. Anticipated Disposition: home likely tomorrow   Objective: Vitals last 24 hrs: Vitals:   09/15/21 2032 09/15/21 2343 09/16/21 0415 09/16/21 0458  BP: 137/62   132/64  Pulse: 84   80  Resp: 17   18  Temp: 98.1 F (36.7 C)   98.2 F (36.8 C)  TempSrc:      SpO2: 92% 91% 92% (!) 88%  Weight:      Height:       Weight change:   Intake/Output Summary (Last 24 hours) at 09/16/2021 0844 Last data filed at 09/15/2021 2233 Gross per 24 hour  Intake 1240 ml  Output 751 ml  Net 489 ml   Net IO Since Admission: 609 mL [09/16/21 0844]   Physical Examination: General exam: Aa0x3,, weak,older than stated age. HEENT:Oral mucosa moist, Ear/Nose WNL grossly,dentition normal. Respiratory system: B/l diminished with expiratory inspiratory wheezing, no use of accessory muscle, non tender. Cardiovascular system: S1 & S2 +,No JVD. Gastrointestinal system: Abdomen soft, NT,ND, BS+. Nervous System:Alert, awake, moving extremities. Extremities: edema none, distal peripheral pulses palpable.  Skin: No rashes, no icterus. MSK: Normal muscle bulk, tone, power.  Medications reviewed:  Scheduled Meds:  budesonide (PULMICORT) nebulizer solution  2 mg Nebulization Q12H   buPROPion  300 mg Oral Daily   cholecalciferol  1,000 Units Oral Daily   dextromethorphan-guaiFENesin  1 tablet Oral BID   FLUoxetine  80 mg Oral Daily   fluticasone furoate-vilanterol  1 puff Inhalation  Daily   insulin aspart  0-15 Units Subcutaneous TID WC   insulin aspart  0-5 Units Subcutaneous QHS   insulin aspart  5 Units Subcutaneous TID WC   insulin glargine-yfgn  70 Units Subcutaneous QHS   ipratropium-albuterol  3 mL Nebulization Q4H   methylPREDNISolone (SOLU-MEDROL) injection  90 mg Intravenous Q12H   metoprolol tartrate  25 mg Oral BID   modafinil  100 mg Oral TID   pantoprazole  40 mg Oral Daily   pramipexole  0.125 mg Oral QHS   prazosin  15 mg Oral QHS   pregabalin  300 mg Oral BID   Continuous Infusions:  azithromycin 500 mg (09/15/21 1700)   Diet Order             Diet heart healthy/carb modified Room service appropriate? Yes; Fluid consistency: Thin  Diet effective now                 Weight change:   Wt Readings from Last 3 Encounters:  09/14/21 124.7 kg  12/25/20 123.2 kg  11/23/19 121.6 kg     Consultants:see note  Procedures:see note Antimicrobials: Anti-infectives (From admission, onward)    Start     Dose/Rate Route Frequency Ordered Stop   09/14/21 1500  azithromycin (ZITHROMAX) 500 mg in sodium chloride 0.9 % 250 mL IVPB        500 mg 250 mL/hr over 60 Minutes Intravenous Every 24 hours 09/14/21 1411 09/19/21 1459      Culture/Microbiology No results found for: SDES, SPECREQUEST, CULT, REPTSTATUS  Other culture-see note  Unresulted Labs (From admission, onward)     Start     Ordered   09/14/21 1412  Expectorated Sputum Assessment w Gram Stain, Rflx to Resp Cult  Once,   R        09/14/21 1411          Data Reviewed: I have personally reviewed following labs and imaging studies CBC: Recent Labs  Lab 09/14/21 0031 09/15/21 0132  WBC 12.2* 17.7*  NEUTROABS 8.3* 14.1*  HGB 13.1 12.7*  HCT 40.1 37.7*  MCV 88.7 87.9  PLT 262 248   Basic Metabolic Panel: Recent Labs  Lab 09/14/21 0031 09/15/21 0132 09/16/21 0238  NA 135 136 135  K 4.1 4.4 4.3  CL 100 102 102  CO2 26 24 23   GLUCOSE 189* 255* 215*  BUN 10 16 18    CREATININE 0.82 0.92 0.89  CALCIUM 8.7* 8.5* 8.3*  MG  --  2.6*  --    GFR: Estimated Creatinine Clearance: 95 mL/min (by C-G formula based on SCr of 0.89 mg/dL). Liver Function Tests: Recent Labs  Lab 09/15/21 0132  AST 18  ALT 18  ALKPHOS 60  BILITOT 0.4  PROT 6.0*  ALBUMIN 3.6   No results for input(s): LIPASE, AMYLASE in the last 168 hours. No results for input(s): AMMONIA in the last 168 hours. Coagulation Profile: No results for input(s): INR, PROTIME in the last 168 hours. Cardiac Enzymes: No results for input(s): CKTOTAL, CKMB, CKMBINDEX, TROPONINI in the last 168 hours. BNP (last 3 results) No results for input(s): PROBNP in the last 8760 hours. HbA1C: Recent Labs    09/14/21 0459  HGBA1C 7.8*   CBG:  Recent Labs  Lab 09/14/21 2142 09/15/21 0858 09/15/21 1121 09/15/21 1620 09/15/21 2036  GLUCAP 200* 279* 328* 275* 171*   Lipid Profile: No results for input(s): CHOL, HDL, LDLCALC, TRIG, CHOLHDL, LDLDIRECT in the last 72 hours. Thyroid Function Tests: No results for input(s): TSH, T4TOTAL, FREET4, T3FREE, THYROIDAB in the last 72 hours. Anemia Panel: No results for input(s): VITAMINB12, FOLATE, FERRITIN, TIBC, IRON, RETICCTPCT in the last 72 hours. Sepsis Labs: No results for input(s): PROCALCITON, LATICACIDVEN in the last 168 hours.  Recent Results (from the past 240 hour(s))  Resp Panel by RT-PCR (Flu A&B, Covid) Nasopharyngeal Swab     Status: None   Collection Time: 09/14/21 12:14 AM   Specimen: Nasopharyngeal Swab; Nasopharyngeal(NP) swabs in vial transport medium  Result Value Ref Range Status   SARS Coronavirus 2 by RT PCR NEGATIVE NEGATIVE Final    Comment: (NOTE) SARS-CoV-2 target nucleic acids are NOT DETECTED.  The SARS-CoV-2 RNA is generally detectable in upper respiratory specimens during the acute phase of infection. The lowest concentration of SARS-CoV-2 viral copies this assay can detect is 138 copies/mL. A negative result does not  preclude SARS-Cov-2 infection and should not be used as the sole basis for treatment or other patient management decisions. A negative result may occur with  improper specimen collection/handling, submission of specimen other than nasopharyngeal swab, presence of viral mutation(s) within the areas targeted by this assay, and inadequate number of viral copies(<138 copies/mL). A negative result must be combined with clinical observations, patient history, and epidemiological information. The expected result is Negative.  Fact Sheet for Patients:  BloggerCourse.com  Fact Sheet for Healthcare Providers:  SeriousBroker.it  This test is no t yet approved or cleared by the [place] FDA and  has been authorized for detection and/or diagnosis of SARS-CoV-2 by FDA under an Emergency Use Authorization (EUA). This EUA will remain  in effect (meaning this test can be used) for the duration of the COVID-19 declaration under Section 564(b)(1) of the Act, 21 U.S.C.section 360bbb-3(b)(1), unless the authorization is terminated  or revoked sooner.       Influenza A by PCR NEGATIVE NEGATIVE Final   Influenza B by PCR NEGATIVE NEGATIVE Final    Comment: (NOTE) The Xpert Xpress SARS-CoV-2/FLU/RSV plus assay is intended as an aid in the diagnosis of influenza from Nasopharyngeal swab specimens and should not be used as a sole basis for treatment. Nasal washings and aspirates are unacceptable for Xpert Xpress SARS-CoV-2/FLU/RSV testing.  Fact Sheet for Patients: BloggerCourse.com  Fact Sheet for Healthcare Providers: SeriousBroker.it  This test is not yet approved or cleared by the [place] FDA and has been authorized for detection and/or diagnosis of SARS-CoV-2 by FDA under an Emergency Use Authorization (EUA). This EUA will remain in effect (meaning this test can be used) for the  duration of the COVID-19 declaration under Section 564(b)(1) of the Act, 21 U.S.C. section 360bbb-3(b)(1), unless the authorization is terminated or revoked.  Performed at [facility], [address], [place], [place] [address]      Radiology Studies: No results found.   LOS: 2 days   Lanae Boast, MD [facility]  09/16/2021, 8:44 AM

## 2021-09-17 LAB — GLUCOSE, CAPILLARY
Glucose-Capillary: 242 mg/dL — ABNORMAL HIGH (ref 70–99)
Glucose-Capillary: 173 mg/dL — ABNORMAL HIGH (ref 70–99)
Glucose-Capillary: 221 mg/dL — ABNORMAL HIGH (ref 70–99)
Glucose-Capillary: 168 mg/dL — ABNORMAL HIGH (ref 70–99)

## 2021-09-17 MED ORDER — INSULIN ASPART 100 UNIT/ML IJ SOLN
15.0000 [IU] | Freq: Three times a day (TID) | INTRAMUSCULAR | Status: DC
  Administered 2021-09-17 – 2021-09-18 (×3): 15 [IU] via SUBCUTANEOUS

## 2021-09-17 MED ORDER — IPRATROPIUM-ALBUTEROL 0.5-2.5 (3) MG/3ML IN SOLN
3.0000 mL | RESPIRATORY_TRACT | Status: DC
  Administered 2021-09-17 – 2021-09-18 (×9): 3 mL via RESPIRATORY_TRACT
  Filled 2021-09-17 (×9): qty 3

## 2021-09-17 MED ORDER — INSULIN GLARGINE-YFGN 100 UNIT/ML ~~LOC~~ SOLN
80.0000 [IU] | Freq: Every day | SUBCUTANEOUS | Status: DC
  Administered 2021-09-17: 80 [IU] via SUBCUTANEOUS
  Filled 2021-09-17 (×2): qty 0.8

## 2021-09-17 NOTE — Progress Notes (Signed)
Inpatient Diabetes Program Recommendations  AACE/ADA: New Consensus Statement on Inpatient Glycemic Control   Target Ranges:  Prepandial:   less than 140 mg/dL      Peak postprandial:   less than 180 mg/dL (1-2 hours)      Critically ill patients:  140 - 180 mg/dL    Latest Reference Range & Units 09/16/21 09:10 09/16/21 11:20 09/16/21 15:51 09/16/21 21:14 09/17/21 06:31  Glucose-Capillary 70 - 99 mg/dL 683 (H) 419 (H) 622 (H) 277 (H) 242 (H)   Review of Glycemic Control  Diabetes history: DM2 Outpatient Diabetes medications: Metformin 1000 mg BID (only DM med on home med list), per [place] office note on 09/06/21, has Semglee 60 units QHS, Glipizide 10 mg BID, Ozempic 1 mg Qweek, and Metformin 1000 mg BID listed as DM meds Current orders for Inpatient glycemic control: Semglee 75 units QHS, Novolog 8 units TID with meals, Novolog 0-15 units TID with meals, Novolog 0-5 units QHS; Solumedrol 90 mg Q12H   Inpatient Diabetes Program Recommendations:     Insulin: If steroids are continued as ordered, please consider increasing Semglee to 80 units QHS and meal coverage to Novolog 12 units TID with meals.   Thanks, Orlando Penner, RN, MSN, CDE Diabetes Coordinator Inpatient Diabetes Program [phone] (Team Pager from 8am to 5pm)

## 2021-09-17 NOTE — Progress Notes (Signed)
PROGRESS NOTE    Vincent Richmond  WGY:[id number] DOB: 03/05/1946 DOA: 09/13/2021 PCP: [facility]   Chief Complaint  Patient presents with   Shortness of Breath   Brief Narrative/Hospital Course:  Vincent Richmond, 75 y.o. male with PMH of asthma-COPD overlap syndrome, OSA on CPAP, type 2 diabetes on insulin, hypertension, history of stroke presented to the ED with shortness of breath, wheezing, going on for a week also a lot of coughing.  Seen by his PCP a week ago given Tessalon Perle, Augmentin Medrol Dosepak and albuterol which patient has been taking without much improvement. He was seen in the ED, chest x-ray cardiomegaly no acute process, patient was found to have acute exacerbation of his asthma-COPD overlap syndrome leukocytosis and admitted for further management   Subjective: Seen examined this morning.  Patient still complains of bouts of cough wheezing sputum production does not feel comfortable going home yet  Remains off oxygen however.  Assessment & Plan:  Acute asthma-COPD overlap syndrome with exacerbation: Patient is very slow to improve although overall improving, not needing oxygen.  He still having wheezing cough spells. COVID and influenza negative D-dimer normal chest x-ray no pneumonia.  Continue current IV Solu-Medrol, Mucinex, current systemic steroids bronchodilators antitussives antibiotics.    Type II diabetes mellitus on insulin A1c 7.8 blood sugar poorly controlled increase Lantus insulin to 80 units and Premeal to 15 units 3 times daily.  Likely worsened by the steroid use.  Continue to monitor closely Recent Labs  Lab 09/16/21 0910 09/16/21 1120 09/16/21 1551 09/16/21 2114 09/17/21 0631  GLUCAP 295* 246* 207* 277* 242*     Hypertension stable on Lopressor-given ongoing wheezing held off  Lopressor Cardiomegaly on chest x-ray 1 view unreliable, no signs symptoms of volume overload.  Fu w/ pcp  OSA: Not using CPAP due to  cough  Leukocytosis: Likely reactive downtrending afebrile.  Chest x-ray no pneumonia.  Class II  Obesity:Patient's Body mass index is 38.34 kg/m. : Will benefit with PCP follow-up, weight loss  healthy lifestyle. Not using cpap now 2/2 cough  DVT prophylaxis: enoxaparin (LOVENOX) injection 40 mg Start: 09/16/21 1000 Place and maintain sequential compression device Start: 09/14/21 0405 Code Status:   Code Status: Full Code Family Communication: plan of care discussed with patient at bedside. Status is: Inpatient Remains inpatient appropriate because: Due to ongoing management of his exacerbation Disposition: Currently not medically stable for discharge. Anticipated Disposition: home likely tomorrow if wheezing and cough improves  Objective: Vitals last 24 hrs: Vitals:   09/16/21 2115 09/17/21 0033 09/17/21 0325 09/17/21 0735  BP: (!) 139/102   138/61  Pulse: 84 85 80 88  Resp: 18 18 18 18   Temp: 98.2 F (36.8 C)   97.8 F (36.6 C)  TempSrc: Oral   Oral  SpO2: 91% 95% 95% 95%  Weight:      Height:       Weight change:   Intake/Output Summary (Last 24 hours) at 09/17/2021 1057 Last data filed at 09/16/2021 1508 Gross per 24 hour  Intake 500 ml  Output --  Net 500 ml    Net IO Since Admission: 1,109 mL [09/17/21 1057]   Physical Examination: General exam: AAOx 3, pleasant, older than stated age, weak appearing. HEENT:Oral mucosa moist, Ear/Nose WNL grossly, dentition normal. Respiratory system: bilaterally diminished air entry along with inspiratory and expiratory wheezing, no use of accessory muscle Cardiovascular system: S1 & S2 +, No JVD,. Gastrointestinal system: Abdomen soft, NT,ND, BS+ Nervous System:Alert, awake, moving  extremities and grossly nonfocal Extremities: no edema, distal peripheral pulses palpable.  Skin: No rashes,no icterus. MSK: Normal muscle bulk,tone, power   Medications reviewed:  Scheduled Meds:  azithromycin  500 mg Oral QPM   budesonide  (PULMICORT) nebulizer solution  2 mg Nebulization Q12H   buPROPion  300 mg Oral Daily   cholecalciferol  1,000 Units Oral Daily   dextromethorphan-guaiFENesin  1 tablet Oral BID   enoxaparin (LOVENOX) injection  40 mg Subcutaneous Q24H   FLUoxetine  80 mg Oral Daily   insulin aspart  0-15 Units Subcutaneous TID WC   insulin aspart  0-5 Units Subcutaneous QHS   insulin aspart  8 Units Subcutaneous TID WC   insulin glargine-yfgn  75 Units Subcutaneous QHS   ipratropium-albuterol  3 mL Nebulization Q4H   methylPREDNISolone (SOLU-MEDROL) injection  90 mg Intravenous Q12H   modafinil  100 mg Oral TID   pantoprazole  40 mg Oral Daily   pramipexole  0.125 mg Oral QHS   prazosin  15 mg Oral QHS   pregabalin  300 mg Oral BID   Continuous Infusions:   Diet Order             Diet heart healthy/carb modified Room service appropriate? Yes; Fluid consistency: Thin  Diet effective now                 Weight change:   Wt Readings from Last 3 Encounters:  09/14/21 124.7 kg  12/25/20 123.2 kg  11/23/19 121.6 kg     Consultants:see note  Procedures:see note Antimicrobials: Anti-infectives (From admission, onward)    Start     Dose/Rate Route Frequency Ordered Stop   09/16/21 1800  azithromycin (ZITHROMAX) tablet 500 mg        500 mg Oral Every evening 09/16/21 1524 09/19/21 1759   09/14/21 1500  azithromycin (ZITHROMAX) 500 mg in sodium chloride 0.9 % 250 mL IVPB  Status:  Discontinued        500 mg 250 mL/hr over 60 Minutes Intravenous Every 24 hours 09/14/21 1411 09/16/21 1524      Culture/Microbiology No results found for: SDES, SPECREQUEST, CULT, REPTSTATUS  Other culture-see note  Unresulted Labs (From admission, onward)     Start     Ordered   09/23/21 0500  Creatinine, serum  (enoxaparin (LOVENOX)    CrCl >/= 30 ml/min)  Weekly,   R     Comments: while on enoxaparin therapy   Question:  Specimen collection method  Answer:  Lab=Lab collect   09/16/21 0846   09/14/21  1412  Expectorated Sputum Assessment w Gram Stain, Rflx to Resp Cult  Once,   R        09/14/21 1411          Data Reviewed: I have personally reviewed following labs and imaging studies CBC: Recent Labs  Lab 09/14/21 0031 09/15/21 0132 09/16/21 0853  WBC 12.2* 17.7* 15.2*  NEUTROABS 8.3* 14.1*  --   HGB 13.1 12.7* 12.9*  HCT 40.1 37.7* 39.2  MCV 88.7 87.9 88.7  PLT 262 248 279    Basic Metabolic Panel: Recent Labs  Lab 09/14/21 0031 09/15/21 0132 09/16/21 0238  NA 135 136 135  K 4.1 4.4 4.3  CL 100 102 102  CO2 GLUCOSE 189* 255* 215*  BUN CREATININE 0.82 0.92 0.89  CALCIUM 8.7* 8.5* 8.3*  MG  --  2.6*  --     GFR: Estimated Creatinine Clearance:  95 mL/min (by C-G formula based on SCr of 0.89 mg/dL). Liver Function Tests: Recent Labs  Lab 09/15/21 0132  AST 18  ALT 18  ALKPHOS 60  BILITOT 0.4  PROT 6.0*  ALBUMIN 3.6    No results for input(s): LIPASE, AMYLASE in the last 168 hours. No results for input(s): AMMONIA in the last 168 hours. Coagulation Profile: No results for input(s): INR, PROTIME in the last 168 hours. Cardiac Enzymes: No results for input(s): CKTOTAL, CKMB, CKMBINDEX, TROPONINI in the last 168 hours. BNP (last 3 results) No results for input(s): PROBNP in the last 8760 hours. HbA1C: No results for input(s): HGBA1C in the last 72 hours.  CBG: Recent Labs  Lab 09/16/21 0910 09/16/21 1120 09/16/21 1551 09/16/21 2114 09/17/21 0631  GLUCAP 295* 246* 207* 277* 242*    Lipid Profile: No results for input(s): CHOL, HDL, LDLCALC, TRIG, CHOLHDL, LDLDIRECT in the last 72 hours. Thyroid Function Tests: No results for input(s): TSH, T4TOTAL, FREET4, T3FREE, THYROIDAB in the last 72 hours. Anemia Panel: No results for input(s): VITAMINB12, FOLATE, FERRITIN, TIBC, IRON, RETICCTPCT in the last 72 hours. Sepsis Labs: No results for input(s): PROCALCITON, LATICACIDVEN in the last 168 hours.  Recent Results (from the  past 240 hour(s))  Resp Panel by RT-PCR (Flu A&B, Covid) Nasopharyngeal Swab     Status: None   Collection Time: 09/14/21 12:14 AM   Specimen: Nasopharyngeal Swab; Nasopharyngeal(NP) swabs in vial transport medium  Result Value Ref Range Status   SARS Coronavirus 2 by RT PCR NEGATIVE NEGATIVE Final    Comment: (NOTE) SARS-CoV-2 target nucleic acids are NOT DETECTED.  The SARS-CoV-2 RNA is generally detectable in upper respiratory specimens during the acute phase of infection. The lowest concentration of SARS-CoV-2 viral copies this assay can detect is 138 copies/mL. A negative result does not preclude SARS-Cov-2 infection and should not be used as the sole basis for treatment or other patient management decisions. A negative result may occur with  improper specimen collection/handling, submission of specimen other than nasopharyngeal swab, presence of viral mutation(s) within the areas targeted by this assay, and inadequate number of viral copies(<138 copies/mL). A negative result must be combined with clinical observations, patient history, and epidemiological information. The expected result is Negative.  Fact Sheet for Patients:  BloggerCourse.com  Fact Sheet for Healthcare Providers:  SeriousBroker.it  This test is no t yet approved or cleared by the [place] FDA and  has been authorized for detection and/or diagnosis of SARS-CoV-2 by FDA under an Emergency Use Authorization (EUA). This EUA will remain  in effect (meaning this test can be used) for the duration of the COVID-19 declaration under Section 564(b)(1) of the Act, 21 U.S.C.section 360bbb-3(b)(1), unless the authorization is terminated  or revoked sooner.       Influenza A by PCR NEGATIVE NEGATIVE Final   Influenza B by PCR NEGATIVE NEGATIVE Final    Comment: (NOTE) The Xpert Xpress SARS-CoV-2/FLU/RSV plus assay is intended as an aid in the diagnosis of  influenza from Nasopharyngeal swab specimens and should not be used as a sole basis for treatment. Nasal washings and aspirates are unacceptable for Xpert Xpress SARS-CoV-2/FLU/RSV testing.  Fact Sheet for Patients: BloggerCourse.com  Fact Sheet for Healthcare Providers: SeriousBroker.it  This test is not yet approved or cleared by the [place] FDA and has been authorized for detection and/or diagnosis of SARS-CoV-2 by FDA under an Emergency Use Authorization (EUA). This EUA will remain in effect (meaning this test can be used)  for the duration of the COVID-19 declaration under Section 564(b)(1) of the Act, 21 U.S.C. section 360bbb-3(b)(1), unless the authorization is terminated or revoked.  Performed at [facility], [address] [place], [place] [address]       Radiology Studies: No results found.   LOS: 3 days   Lanae Boast, MD [facility]  09/17/2021, 10:57 AM

## 2021-09-17 NOTE — Progress Notes (Signed)
Patient refused CPAP for the night  

## 2021-09-18 ENCOUNTER — Other Ambulatory Visit (HOSPITAL_COMMUNITY): Payer: Self-pay

## 2021-09-18 LAB — GLUCOSE, CAPILLARY
Glucose-Capillary: 268 mg/dL — ABNORMAL HIGH (ref 70–99)
Glucose-Capillary: 282 mg/dL — ABNORMAL HIGH (ref 70–99)

## 2021-09-18 MED ORDER — DM-GUAIFENESIN ER 30-600 MG PO TB12: 1.0000 | ORAL_TABLET | Freq: Two times a day (BID) | ORAL | 0 refills | Status: AC

## 2021-09-18 MED ORDER — BENZONATATE 100 MG PO CAPS: 100.0000 mg | ORAL_CAPSULE | Freq: Three times a day (TID) | ORAL | 0 refills | Status: DC | PRN

## 2021-09-18 MED ORDER — PRAMIPEXOLE DIHYDROCHLORIDE 0.5 MG PO TABS: 0.5000 mg | ORAL_TABLET | Freq: Every day | ORAL | Status: AC

## 2021-09-18 MED ORDER — BUDESONIDE-FORMOTEROL FUMARATE 160-4.5 MCG/ACT IN AERO
2.0000 | INHALATION_SPRAY | Freq: Two times a day (BID) | RESPIRATORY_TRACT | 0 refills | Status: DC
Start: 2021-09-18 — End: 2021-09-18
  Filled 2021-09-18: qty 10.2, 30d supply, fill #0

## 2021-09-18 MED ORDER — BUDESONIDE-FORMOTEROL FUMARATE 160-4.5 MCG/ACT IN AERO
2.0000 | INHALATION_SPRAY | Freq: Two times a day (BID) | RESPIRATORY_TRACT | 0 refills | Status: DC
Start: 2021-09-18 — End: 2021-10-08

## 2021-09-18 MED ORDER — AZITHROMYCIN 500 MG PO TABS
500.0000 mg | ORAL_TABLET | Freq: Every evening | ORAL | 0 refills | Status: DC
  Filled 2021-09-18: qty 1, 1d supply, fill #0

## 2021-09-18 MED ORDER — DM-GUAIFENESIN ER 30-600 MG PO TB12
1.0000 | ORAL_TABLET | Freq: Two times a day (BID) | ORAL | 0 refills | Status: DC
  Filled 2021-09-18: qty 14, 7d supply, fill #0

## 2021-09-18 MED ORDER — METHYLPREDNISOLONE 4 MG PO TBPK: ORAL_TABLET | ORAL | 0 refills | Status: DC

## 2021-09-18 MED ORDER — AZITHROMYCIN 500 MG PO TABS: 500.0000 mg | ORAL_TABLET | Freq: Every evening | ORAL | 0 refills | Status: AC

## 2021-09-18 MED ORDER — BENZONATATE 100 MG PO CAPS
100.0000 mg | ORAL_CAPSULE | Freq: Three times a day (TID) | ORAL | 0 refills | Status: DC | PRN
  Filled 2021-09-18: qty 20, 7d supply, fill #0

## 2021-09-18 NOTE — Discharge Summary (Signed)
Physician Discharge Summary  Vincent Richmond BUL:[id number] DOB: 1946-02-19 DOA: 09/13/2021  PCP: [facility]  Admit date: 09/13/2021 Discharge date: 09/18/2021  Admitted From: home Disposition:  home  Recommendations for Outpatient Follow-up:  Follow up with PCP in 1-2 weeks Please obtain BMP/CBC in one week  Home Health:no  Equipment/Devices: none  Discharge Condition: Stable Code Status:   Code Status: Full Code Diet recommendation:  Diet Order             Diet heart healthy/carb modified Room service appropriate? Yes; Fluid consistency: Thin  Diet effective now                   Brief/Interim Summary: 75 y.o. male with PMH of asthma-COPD overlap syndrome, OSA on CPAP, type 2 diabetes on insulin, hypertension, history of stroke presented to the ED with shortness of breath, wheezing, going on for a week also a lot of coughing.  Seen by his PCP a week ago given Tessalon Perle, Augmentin Medrol Dosepak and albuterol which patient has been taking without much improvement. He was seen in the ED, chest x-ray cardiomegaly no acute process, patient was found to have acute exacerbation of his asthma-COPD overlap syndrome leukocytosis and admitted for further management His COVID and influenza negative D-dimer.Patient was managed systemic steroids antibiotics, he had ongoing wheezing coughing bouts and shortness of breath.  Slow to improve and subsequently has stabilized, is not needing oxygen, wheezing is much improved.  He will go home on oral steroid taper antitussives Symbicort home inhaler.  At this time is medically stable for discharge home  Discharge Diagnoses:   Acute asthma-COPD overlap syndrome with exacerbation: Clinically improved continue Medrol Dosepak, bronchodilators antitussives azithromycin.      Type II diabetes mellitus on insulin A1c 7.8 blood sugar high in the setting of steroid use, he will resume his home insulin upon discharge.  Check sugar 4 times a  day at home and adjust insulin and follow-up with PCP  Hypertension cont home meds Cardiomegaly on chest x-ray 1 view unreliable, no signs symptoms of volume overload.  Fu w/ pcp OSA: Not using CPAP due to cough Leukocytosis: Likely reactive downtrending afebrile.  Chest x-ray no pneumonia. Class II  Obesity:Patient's Body mass index is 38.34 kg/m. : Will benefit with PCP follow-up, weight loss  healthy lifestyle. Not using cpap now 2/2 cough  Consults: toc  Subjective: No coughing this morning, is alert, awake,NAD.  Discharge Exam: Vitals:   09/18/21 0715 09/18/21 0804  BP:  (!) 151/66  Pulse: 94 84  Resp: 16 17  Temp:  97.6 F (36.4 C)  SpO2: 93% 92%   General: Pt is alert, awake, not in acute distress Cardiovascular: RRR, S1/S2 +, no rubs, no gallops Respiratory: CTA bilaterally, no wheezing, no rhonchi Abdominal: Soft, NT, ND, bowel sounds + Extremities: no edema, no cyanosis  Discharge Instructions  Discharge Instructions     Discharge instructions   Complete by: As directed    Please call call MD or return to ER for similar or worsening recurring problem that brought you to hospital or if any fever,nausea/vomiting,abdominal pain, uncontrolled pain, chest pain,  shortness of breath or any other alarming symptoms.  Please follow-up your doctor as instructed in a week time and call the office for appointment.  Please avoid alcohol, smoking, or any other illicit substance and maintain healthy habits including taking your regular medications as prescribed.  You were cared for by a hospitalist during your hospital stay. If you have  any questions about your discharge medications or the care you received while you were in the hospital after you are discharged, you can call the unit and ask to speak with the hospitalist on call if the hospitalist that took care of you is not available.  Once you are discharged, your primary care physician will handle any further medical  issues. Please note that NO REFILLS for any discharge medications will be authorized once you are discharged, as it is imperative that you return to your primary care physician (or establish a relationship with a primary care physician if you do not have one) for your aftercare needs so that they can reassess your need for medications and monitor your lab values   Increase activity slowly   Complete by: As directed       Allergies as of 09/18/2021       Reactions   Pregabalin Diarrhea   Pt takes this at home daily   Statins Other (See Comments)   Severe muscle pains        Medication List     TAKE these medications    albuterol 108 (90 Base) MCG/ACT inhaler Commonly known as: VENTOLIN HFA Inhale 2 puffs into the lungs every 6 (six) hours as needed for wheezing or shortness of breath.   ALPHA LIPOIC ACID PO Take 1 tablet by mouth daily.   aspirin EC 325 MG tablet Take 325 mg by mouth daily.   azithromycin 500 MG tablet Commonly known as: ZITHROMAX Take 1 tablet (500 mg total) by mouth every evening for 1 dose.   benzonatate 100 MG capsule Commonly known as: TESSALON Take 1 capsule (100 mg total) by mouth 3 (three) times daily as needed for up to 20 doses for cough.   budesonide-formoterol 160-4.5 MCG/ACT inhaler Commonly known as: Symbicort Inhale 2 puffs into the lungs 2 (two) times daily.   buPROPion 300 MG 24 hr tablet Commonly known as: WELLBUTRIN XL Take 300 mg by mouth daily.   CENTRUM SILVER ADULT 50+ PO Take 1 tablet by mouth daily.   cholecalciferol 1000 units tablet Commonly known as: VITAMIN D Take 1,000 Units by mouth daily.   COLLAGEN PO Take 1 capsule by mouth daily.   COMBIVENT RESPIMAT IN Inhale 1 puff into the lungs 4 (four) times daily.   dextromethorphan-guaiFENesin 30-600 MG 12hr tablet Commonly known as: MUCINEX DM Take 1 tablet by mouth 2 (two) times daily for 7 days.   DIABETIC TUSSIN PO Take 10 mLs by mouth 2 (two) times daily  as needed (cough).   Fish Oil 1000 MG Caps Take 1,000 mg by mouth daily.   FLUoxetine 20 MG capsule Commonly known as: PROZAC Take 80 mg by mouth daily. For depression and anxiety -4 tabs in am   insulin glargine 100 unit/mL Sopn Commonly known as: LANTUS Inject 65 Units into the skin at bedtime.   loperamide 2 MG capsule Commonly known as: IMODIUM Take 4 mg by mouth daily.   metFORMIN 1000 MG tablet Commonly known as: GLUCOPHAGE Take 1,000 mg by mouth 2 (two) times daily with a meal.   methylPREDNISolone 4 MG Tbpk tablet Commonly known as: MEDROL DOSEPAK 6,5,4,3,2,1 What changed:  how much to take how to take this when to take this   metoprolol tartrate 25 MG tablet Commonly known as: LOPRESSOR Take 1 tablet (25 mg total) by mouth 2 (two) times daily.   modafinil 100 MG tablet Commonly known as: PROVIGIL Take 100 mg by mouth 3 (three) times  daily.   NON FORMULARY CPAP Machine daily   omeprazole 20 MG capsule Commonly known as: PRILOSEC Take 20 mg by mouth daily.   OVER THE COUNTER MEDICATION Take 1 tablet by mouth daily. Tumeric curcumin   pramipexole 0.5 MG tablet Commonly known as: MIRAPEX Take 1 tablet (0.5 mg total) by mouth at bedtime. What changed: how much to take   prazosin 5 MG capsule Commonly known as: MINIPRESS Take 15 mg by mouth at bedtime. For nightmares and flashbacks   pregabalin 300 MG capsule Commonly known as: LYRICA Take 300 mg by mouth 2 (two) times daily.   PROBIOTIC DAILY PO Take 1 capsule by mouth daily.   VITAMIN C PO Take 1,000 mg by mouth daily.        Follow-up Information     [facility] in 1 week(s).   Contact information: [address] [facility] [place] [address] [phone]                Allergies  Allergen Reactions   Pregabalin Diarrhea    Pt takes this at home daily   Statins Other (See Comments)    Severe muscle pains    The results of significant  diagnostics from this hospitalization (including imaging, microbiology, ancillary and laboratory) are listed below for reference.    Microbiology: Recent Results (from the past 240 hour(s))  Resp Panel by RT-PCR (Flu A&B, Covid) Nasopharyngeal Swab     Status: None   Collection Time: 09/14/21 12:14 AM   Specimen: Nasopharyngeal Swab; Nasopharyngeal(NP) swabs in vial transport medium  Result Value Ref Range Status   SARS Coronavirus 2 by RT PCR NEGATIVE NEGATIVE Final    Comment: (NOTE) SARS-CoV-2 target nucleic acids are NOT DETECTED.  The SARS-CoV-2 RNA is generally detectable in upper respiratory specimens during the acute phase of infection. The lowest concentration of SARS-CoV-2 viral copies this assay can detect is 138 copies/mL. A negative result does not preclude SARS-Cov-2 infection and should not be used as the sole basis for treatment or other patient management decisions. A negative result may occur with  improper specimen collection/handling, submission of specimen other than nasopharyngeal swab, presence of viral mutation(s) within the areas targeted by this assay, and inadequate number of viral copies(<138 copies/mL). A negative result must be combined with clinical observations, patient history, and epidemiological information. The expected result is Negative.  Fact Sheet for Patients:  BloggerCourse.com  Fact Sheet for Healthcare Providers:  SeriousBroker.it  This test is no t yet approved or cleared by the [place] FDA and  has been authorized for detection and/or diagnosis of SARS-CoV-2 by FDA under an Emergency Use Authorization (EUA). This EUA will remain  in effect (meaning this test can be used) for the duration of the COVID-19 declaration under Section 564(b)(1) of the Act, 21 U.S.C.section 360bbb-3(b)(1), unless the authorization is terminated  or revoked sooner.       Influenza A by PCR NEGATIVE  NEGATIVE Final   Influenza B by PCR NEGATIVE NEGATIVE Final    Comment: (NOTE) The Xpert Xpress SARS-CoV-2/FLU/RSV plus assay is intended as an aid in the diagnosis of influenza from Nasopharyngeal swab specimens and should not be used as a sole basis for treatment. Nasal washings and aspirates are unacceptable for Xpert Xpress SARS-CoV-2/FLU/RSV testing.  Fact Sheet for Patients: BloggerCourse.com  Fact Sheet for Healthcare Providers: SeriousBroker.it  This test is not yet approved or cleared by the [place] FDA and has been authorized for detection and/or  diagnosis of SARS-CoV-2 by FDA under an Emergency Use Authorization (EUA). This EUA will remain in effect (meaning this test can be used) for the duration of the COVID-19 declaration under Section 564(b)(1) of the Act, 21 U.S.C. section 360bbb-3(b)(1), unless the authorization is terminated or revoked.  Performed at [facility], [address] [place], [place] [address]     Procedures/Studies: DG Chest Portable 1 View  Result Date: 09/14/2021 CLINICAL DATA:  Shortness of breath. EXAM: PORTABLE CHEST 1 VIEW COMPARISON:  10/30/2018. FINDINGS: The heart is enlarged and the mediastinal structures are within normal limits. No consolidation, effusion, or pneumothorax. Degenerative changes are present in the thoracic spine. No acute osseous abnormality. IMPRESSION: Cardiomegaly with no acute process. Electronically Signed   By: Thornell Sartorius M.D.   On: 09/14/2021 01:37    Labs: BNP (last 3 results) No results for input(s): BNP in the last 8760 hours. Basic Metabolic Panel: Recent Labs  Lab 09/14/21 0031 09/15/21 0132 09/16/21 0238  NA 135 136 135  K 4.1 4.4 4.3  CL 100 102 102  CO2 GLUCOSE 189* 255* 215*  BUN CREATININE 0.82 0.92 0.89  CALCIUM 8.7* 8.5* 8.3*  MG  --  2.6*  --    Liver Function Tests: Recent Labs  Lab 09/15/21 0132   AST 18  ALT 18  ALKPHOS 60  BILITOT 0.4  PROT 6.0*  ALBUMIN 3.6   No results for input(s): LIPASE, AMYLASE in the last 168 hours. No results for input(s): AMMONIA in the last 168 hours. CBC: Recent Labs  Lab 09/14/21 0031 09/15/21 0132 09/16/21 0853  WBC 12.2* 17.7* 15.2*  NEUTROABS 8.3* 14.1*  --   HGB 13.1 12.7* 12.9*  HCT 40.1 37.7* 39.2  MCV 88.7 87.9 88.7  PLT 262 248 279   Cardiac Enzymes: No results for input(s): CKTOTAL, CKMB, CKMBINDEX, TROPONINI in the last 168 hours. BNP: Invalid input(s): POCBNP CBG: Recent Labs  Lab 09/17/21 1252 09/17/21 1705 09/17/21 2023 09/18/21 0655 09/18/21 0801  GLUCAP 173* 221* 168* 268* 282*   D-Dimer No results for input(s): DDIMER in the last 72 hours. Hgb A1c No results for input(s): HGBA1C in the last 72 hours. Lipid Profile No results for input(s): CHOL, HDL, LDLCALC, TRIG, CHOLHDL, LDLDIRECT in the last 72 hours. Thyroid function studies No results for input(s): TSH, T4TOTAL, T3FREE, THYROIDAB in the last 72 hours.  Invalid input(s): FREET3 Anemia work up No results for input(s): VITAMINB12, FOLATE, FERRITIN, TIBC, IRON, RETICCTPCT in the last 72 hours. Urinalysis No results found for: COLORURINE, APPEARANCEUR, LABSPEC, PHURINE, GLUCOSEU, HGBUR, BILIRUBINUR, KETONESUR, PROTEINUR, UROBILINOGEN, NITRITE, LEUKOCYTESUR Sepsis Labs Invalid input(s): PROCALCITONIN,  WBC,  LACTICIDVEN Microbiology Recent Results (from the past 240 hour(s))  Resp Panel by RT-PCR (Flu A&B, Covid) Nasopharyngeal Swab     Status: None   Collection Time: 09/14/21 12:14 AM   Specimen: Nasopharyngeal Swab; Nasopharyngeal(NP) swabs in vial transport medium  Result Value Ref Range Status   SARS Coronavirus 2 by RT PCR NEGATIVE NEGATIVE Final    Comment: (NOTE) SARS-CoV-2 target nucleic acids are NOT DETECTED.  The SARS-CoV-2 RNA is generally detectable in upper respiratory specimens during the acute phase of infection. The  lowest concentration of SARS-CoV-2 viral copies this assay can detect is 138 copies/mL. A negative result does not preclude SARS-Cov-2 infection and should not be used as the sole basis for treatment or other patient management decisions. A negative result may occur with  improper specimen  collection/handling, submission of specimen other than nasopharyngeal swab, presence of viral mutation(s) within the areas targeted by this assay, and inadequate number of viral copies(<138 copies/mL). A negative result must be combined with clinical observations, patient history, and epidemiological information. The expected result is Negative.  Fact Sheet for Patients:  BloggerCourse.com  Fact Sheet for Healthcare Providers:  SeriousBroker.it  This test is no t yet approved or cleared by the [place] FDA and  has been authorized for detection and/or diagnosis of SARS-CoV-2 by FDA under an Emergency Use Authorization (EUA). This EUA will remain  in effect (meaning this test can be used) for the duration of the COVID-19 declaration under Section 564(b)(1) of the Act, 21 U.S.C.section 360bbb-3(b)(1), unless the authorization is terminated  or revoked sooner.       Influenza A by PCR NEGATIVE NEGATIVE Final   Influenza B by PCR NEGATIVE NEGATIVE Final    Comment: (NOTE) The Xpert Xpress SARS-CoV-2/FLU/RSV plus assay is intended as an aid in the diagnosis of influenza from Nasopharyngeal swab specimens and should not be used as a sole basis for treatment. Nasal washings and aspirates are unacceptable for Xpert Xpress SARS-CoV-2/FLU/RSV testing.  Fact Sheet for Patients: BloggerCourse.com  Fact Sheet for Healthcare Providers: SeriousBroker.it  This test is not yet approved or cleared by the [place] FDA and has been authorized for detection and/or diagnosis of SARS-CoV-2 by FDA under  an Emergency Use Authorization (EUA). This EUA will remain in effect (meaning this test can be used) for the duration of the COVID-19 declaration under Section 564(b)(1) of the Act, 21 U.S.C. section 360bbb-3(b)(1), unless the authorization is terminated or revoked.  Performed at [facility], [address] [place], [place] [address]      Time coordinating discharge: 25 minutes  SIGNED: Lanae Boast, MD  [facility] 09/18/2021, 9:42 AM  If 7PM-7AM, please contact night-coverage [email or web address]

## 2021-09-18 NOTE — TOC Transition Note (Incomplete)
Transition of Care [facility]) - CM/SW Discharge Note   Patient Details  Name: Vincent Richmond MRN: [id number] Date of Birth: 02/19/1946  [facility] CM/SW Contact:  Epifanio Lesches, RN Phone Number: 09/18/2021, 10:56 AM   Clinical Narrative:       Final next level of care: Home/Self Care Barriers to Discharge: No Barriers Identified   Patient Goals and CMS Choice        Discharge Placement                       Discharge Plan and Services   Discharge Planning Services: CM Consult                                 Social Determinants of Health (SDOH) Interventions     Readmission Risk Interventions No flowsheet data found.

## 2021-09-18 NOTE — Progress Notes (Signed)
Mobility Specialist Criteria Algorithm Info.    09/18/21 1015  Mobility  Activity Ambulated in room;Dangled on edge of bed  Range of Motion/Exercises Active;All extremities  Level of Assistance Standby assist, set-up cues, supervision of patient - no hands on  Assistive Device Front wheel walker  Distance Ambulated (ft) 25 ft  Mobility Ambulated with assistance in hallway  Mobility Response Tolerated well  Mobility performed by Mobility specialist  Bed Position Semi-fowlers   Patient received dangling EOB. Agreed to participate with anticipation to be discharged soon. Ambulated in room supervision level with steady gait. Returned to EOB without incident or complaint. Was left with all needs met and call bell in reach.  09/18/2021 10:15 AM

## 2021-10-07 ENCOUNTER — Other Ambulatory Visit: Payer: Self-pay

## 2021-10-07 ENCOUNTER — Inpatient Hospital Stay (HOSPITAL_COMMUNITY)
Admission: EM | Admit: 2021-10-07 | Discharge: 2021-10-13 | DRG: 193 | Disposition: A | Discharge status: Home Health | Payer: No Typology Code available for payment source | Attending: Internal Medicine | Admitting: Internal Medicine

## 2021-10-07 ENCOUNTER — Emergency Department (HOSPITAL_COMMUNITY): Payer: No Typology Code available for payment source

## 2021-10-07 ENCOUNTER — Encounter (HOSPITAL_COMMUNITY): Payer: Self-pay | Admitting: Emergency Medicine

## 2021-10-07 DIAGNOSIS — I10 Essential (primary) hypertension: Secondary | ICD-10-CM | POA: Diagnosis present

## 2021-10-07 DIAGNOSIS — J9601 Acute respiratory failure with hypoxia: Secondary | ICD-10-CM | POA: Diagnosis present

## 2021-10-07 DIAGNOSIS — Z87891 Personal history of nicotine dependence: Secondary | ICD-10-CM

## 2021-10-07 DIAGNOSIS — D72829 Elevated white blood cell count, unspecified: Secondary | ICD-10-CM | POA: Diagnosis present

## 2021-10-07 DIAGNOSIS — Z8249 Family history of ischemic heart disease and other diseases of the circulatory system: Secondary | ICD-10-CM

## 2021-10-07 DIAGNOSIS — Z809 Family history of malignant neoplasm, unspecified: Secondary | ICD-10-CM

## 2021-10-07 DIAGNOSIS — E1165 Type 2 diabetes mellitus with hyperglycemia: Secondary | ICD-10-CM | POA: Diagnosis present

## 2021-10-07 DIAGNOSIS — I5033 Acute on chronic diastolic (congestive) heart failure: Secondary | ICD-10-CM | POA: Diagnosis present

## 2021-10-07 DIAGNOSIS — Z7984 Long term (current) use of oral hypoglycemic drugs: Secondary | ICD-10-CM

## 2021-10-07 DIAGNOSIS — J189 Pneumonia, unspecified organism: Principal | ICD-10-CM | POA: Diagnosis present

## 2021-10-07 DIAGNOSIS — Z79899 Other long term (current) drug therapy: Secondary | ICD-10-CM

## 2021-10-07 DIAGNOSIS — R0603 Acute respiratory distress: Secondary | ICD-10-CM

## 2021-10-07 DIAGNOSIS — E785 Hyperlipidemia, unspecified: Secondary | ICD-10-CM | POA: Diagnosis present

## 2021-10-07 DIAGNOSIS — Z20822 Contact with and (suspected) exposure to covid-19: Secondary | ICD-10-CM | POA: Diagnosis present

## 2021-10-07 DIAGNOSIS — J4489 Other specified chronic obstructive pulmonary disease: Secondary | ICD-10-CM | POA: Diagnosis present

## 2021-10-07 DIAGNOSIS — F4310 Post-traumatic stress disorder, unspecified: Secondary | ICD-10-CM | POA: Diagnosis present

## 2021-10-07 DIAGNOSIS — R0602 Shortness of breath: Secondary | ICD-10-CM | POA: Diagnosis not present

## 2021-10-07 DIAGNOSIS — Z794 Long term (current) use of insulin: Secondary | ICD-10-CM

## 2021-10-07 DIAGNOSIS — E1140 Type 2 diabetes mellitus with diabetic neuropathy, unspecified: Secondary | ICD-10-CM | POA: Diagnosis present

## 2021-10-07 DIAGNOSIS — E119 Type 2 diabetes mellitus without complications: Secondary | ICD-10-CM

## 2021-10-07 DIAGNOSIS — K219 Gastro-esophageal reflux disease without esophagitis: Secondary | ICD-10-CM | POA: Diagnosis present

## 2021-10-07 DIAGNOSIS — J449 Chronic obstructive pulmonary disease, unspecified: Secondary | ICD-10-CM

## 2021-10-07 DIAGNOSIS — J441 Chronic obstructive pulmonary disease with (acute) exacerbation: Secondary | ICD-10-CM | POA: Diagnosis present

## 2021-10-07 DIAGNOSIS — J440 Chronic obstructive pulmonary disease with acute lower respiratory infection: Secondary | ICD-10-CM | POA: Diagnosis present

## 2021-10-07 DIAGNOSIS — Z8673 Personal history of transient ischemic attack (TIA), and cerebral infarction without residual deficits: Secondary | ICD-10-CM

## 2021-10-07 DIAGNOSIS — G4733 Obstructive sleep apnea (adult) (pediatric): Secondary | ICD-10-CM | POA: Diagnosis present

## 2021-10-07 DIAGNOSIS — I110 Hypertensive heart disease with heart failure: Secondary | ICD-10-CM | POA: Diagnosis present

## 2021-10-07 LAB — CBC WITH DIFFERENTIAL/PLATELET
Abs Immature Granulocytes: 0.11 10*3/uL — ABNORMAL HIGH (ref 0.00–0.07)
Basophils Absolute: 0.1 10*3/uL (ref 0.0–0.1)
Basophils Relative: 1 %
Eosinophils Absolute: 1 10*3/uL — ABNORMAL HIGH (ref 0.0–0.5)
Eosinophils Relative: 9 %
HCT: 39.6 % (ref 39.0–52.0)
Hemoglobin: 13 g/dL (ref 13.0–17.0)
Immature Granulocytes: 1 %
Lymphocytes Relative: 14 %
Lymphs Abs: 1.6 10*3/uL (ref 0.7–4.0)
MCH: 29.1 pg (ref 26.0–34.0)
MCHC: 32.8 g/dL (ref 30.0–36.0)
MCV: 88.8 fL (ref 80.0–100.0)
Monocytes Absolute: 0.6 10*3/uL (ref 0.1–1.0)
Monocytes Relative: 5 %
Neutro Abs: 7.9 10*3/uL — ABNORMAL HIGH (ref 1.7–7.7)
Neutrophils Relative %: 70 %
Platelets: 286 10*3/uL (ref 150–400)
RBC: 4.46 MIL/uL (ref 4.22–5.81)
RDW: 13.6 % (ref 11.5–15.5)
WBC: 11.2 10*3/uL — ABNORMAL HIGH (ref 4.0–10.5)
nRBC: 0 % (ref 0.0–0.2)

## 2021-10-07 LAB — BASIC METABOLIC PANEL
Anion gap: 9 (ref 5–15)
BUN: 10 mg/dL (ref 8–23)
CO2: 27 mmol/L (ref 22–32)
Calcium: 8.9 mg/dL (ref 8.9–10.3)
Chloride: 98 mmol/L (ref 98–111)
Creatinine, Ser: 0.82 mg/dL (ref 0.61–1.24)
GFR, Estimated: >60 mL/min (ref >60)
Glucose, Bld: 205 mg/dL — ABNORMAL HIGH (ref 70–99)
Potassium: 4.3 mmol/L (ref 3.5–5.1)
Sodium: 134 mmol/L — ABNORMAL LOW (ref 135–145)

## 2021-10-07 MED ORDER — IPRATROPIUM-ALBUTEROL 0.5-2.5 (3) MG/3ML IN SOLN
3.0000 mL | Freq: Once | RESPIRATORY_TRACT | Status: AC
  Administered 2021-10-08: 3 mL via RESPIRATORY_TRACT

## 2021-10-07 MED ORDER — METHYLPREDNISOLONE SODIUM SUCC 125 MG IJ SOLR
125.0000 mg | Freq: Once | INTRAMUSCULAR | Status: AC
  Administered 2021-10-08: 125 mg via INTRAVENOUS

## 2021-10-07 NOTE — ED Triage Notes (Signed)
Patient reports chronic SOB for > 1 month worse today with wheezing , denies chest pain , no cough or fever .

## 2021-10-07 NOTE — ED Provider Notes (Signed)
Emergency Medicine Provider Triage Evaluation Note  Vincent Richmond , a 76 y.o. male  was evaluated in triage.  Pt complains of shortness of breath is been worsening of the last couple weeks.  Patient does have a history of COPD and was seen for similar symptoms 3 weeks ago.  No fever, no chills, no chest pain.  Patient had a breathing treatment just prior to arrival.  Review of Systems  Positive:  Negative: See above   Physical Exam  BP (!) 182/61 (BP Location: Left Arm)    Pulse 95    Temp 98.4 F (36.9 C) (Oral)    Resp (!) 24    SpO2 94%  Gen:   Awake, no distress   Resp:  Tachypneic.  Diffuse expiratory wheeze. MSK:   Moves extremities without difficulty  Other:    Medical Decision Making  Medically screening exam initiated at 9:06 PM.  Appropriate orders placed.  Clemmie Jeffs was informed that the remainder of the evaluation will be completed by another provider, this initial triage assessment does not replace that evaluation, and the importance of remaining in the ED until their evaluation is complete.     Hendricks Limes, PA-C 10/07/21 2107    Jeanell Sparrow, DO 10/08/21 0011

## 2021-10-08 ENCOUNTER — Encounter (HOSPITAL_COMMUNITY): Payer: Self-pay | Admitting: Emergency Medicine

## 2021-10-08 ENCOUNTER — Inpatient Hospital Stay (HOSPITAL_COMMUNITY): Payer: No Typology Code available for payment source

## 2021-10-08 DIAGNOSIS — K219 Gastro-esophageal reflux disease without esophagitis: Secondary | ICD-10-CM

## 2021-10-08 DIAGNOSIS — I10 Essential (primary) hypertension: Secondary | ICD-10-CM

## 2021-10-08 DIAGNOSIS — Z8249 Family history of ischemic heart disease and other diseases of the circulatory system: Secondary | ICD-10-CM | POA: Diagnosis not present

## 2021-10-08 DIAGNOSIS — J9601 Acute respiratory failure with hypoxia: Secondary | ICD-10-CM | POA: Diagnosis present

## 2021-10-08 DIAGNOSIS — Z8673 Personal history of transient ischemic attack (TIA), and cerebral infarction without residual deficits: Secondary | ICD-10-CM | POA: Diagnosis not present

## 2021-10-08 DIAGNOSIS — E119 Type 2 diabetes mellitus without complications: Secondary | ICD-10-CM

## 2021-10-08 DIAGNOSIS — I5033 Acute on chronic diastolic (congestive) heart failure: Secondary | ICD-10-CM | POA: Diagnosis present

## 2021-10-08 DIAGNOSIS — Z809 Family history of malignant neoplasm, unspecified: Secondary | ICD-10-CM | POA: Diagnosis not present

## 2021-10-08 DIAGNOSIS — Z87891 Personal history of nicotine dependence: Secondary | ICD-10-CM | POA: Diagnosis not present

## 2021-10-08 DIAGNOSIS — J441 Chronic obstructive pulmonary disease with (acute) exacerbation: Secondary | ICD-10-CM | POA: Diagnosis present

## 2021-10-08 DIAGNOSIS — G4733 Obstructive sleep apnea (adult) (pediatric): Secondary | ICD-10-CM

## 2021-10-08 DIAGNOSIS — J189 Pneumonia, unspecified organism: Secondary | ICD-10-CM | POA: Diagnosis present

## 2021-10-08 DIAGNOSIS — J449 Chronic obstructive pulmonary disease, unspecified: Secondary | ICD-10-CM

## 2021-10-08 DIAGNOSIS — Z794 Long term (current) use of insulin: Secondary | ICD-10-CM | POA: Diagnosis not present

## 2021-10-08 DIAGNOSIS — R0602 Shortness of breath: Secondary | ICD-10-CM | POA: Diagnosis present

## 2021-10-08 DIAGNOSIS — F4310 Post-traumatic stress disorder, unspecified: Secondary | ICD-10-CM

## 2021-10-08 DIAGNOSIS — J440 Chronic obstructive pulmonary disease with acute lower respiratory infection: Secondary | ICD-10-CM | POA: Diagnosis present

## 2021-10-08 DIAGNOSIS — E1165 Type 2 diabetes mellitus with hyperglycemia: Secondary | ICD-10-CM | POA: Diagnosis present

## 2021-10-08 DIAGNOSIS — D72829 Elevated white blood cell count, unspecified: Secondary | ICD-10-CM

## 2021-10-08 DIAGNOSIS — E1140 Type 2 diabetes mellitus with diabetic neuropathy, unspecified: Secondary | ICD-10-CM | POA: Diagnosis present

## 2021-10-08 DIAGNOSIS — Z20822 Contact with and (suspected) exposure to covid-19: Secondary | ICD-10-CM | POA: Diagnosis present

## 2021-10-08 DIAGNOSIS — R0603 Acute respiratory distress: Secondary | ICD-10-CM

## 2021-10-08 DIAGNOSIS — Z79899 Other long term (current) drug therapy: Secondary | ICD-10-CM | POA: Diagnosis not present

## 2021-10-08 DIAGNOSIS — E785 Hyperlipidemia, unspecified: Secondary | ICD-10-CM | POA: Diagnosis present

## 2021-10-08 DIAGNOSIS — I110 Hypertensive heart disease with heart failure: Secondary | ICD-10-CM | POA: Diagnosis present

## 2021-10-08 DIAGNOSIS — I5031 Acute diastolic (congestive) heart failure: Secondary | ICD-10-CM | POA: Diagnosis not present

## 2021-10-08 DIAGNOSIS — Z7984 Long term (current) use of oral hypoglycemic drugs: Secondary | ICD-10-CM | POA: Diagnosis not present

## 2021-10-08 LAB — RESP PANEL BY RT-PCR (FLU A&B, COVID) ARPGX2
Influenza A by PCR: NEGATIVE
Influenza B by PCR: NEGATIVE
SARS Coronavirus 2 by RT PCR: NEGATIVE

## 2021-10-08 LAB — ECHOCARDIOGRAM COMPLETE
AR max vel: 3.04 cm2
AV Area VTI: 2.77 cm2
AV Area mean vel: 2.65 cm2
AV Mean grad: 3 mmHg
AV Peak grad: 6.2 mmHg
Ao pk vel: 1.24 m/s
Area-P 1/2: 4.06 cm2
MV VTI: 2.64 cm2
S' Lateral: 2.3 cm

## 2021-10-08 LAB — CBG MONITORING, ED: Glucose-Capillary: 179 mg/dL — ABNORMAL HIGH (ref 70–99)

## 2021-10-08 LAB — TROPONIN I (HIGH SENSITIVITY)
Troponin I (High Sensitivity): 12 ng/L (ref <18)
Troponin I (High Sensitivity): 15 ng/L (ref <18)

## 2021-10-08 LAB — HEPATIC FUNCTION PANEL
ALT: 19 U/L (ref 0–44)
AST: 17 U/L (ref 15–41)
Albumin: 3.7 g/dL (ref 3.5–5.0)
Alkaline Phosphatase: 67 U/L (ref 38–126)
Bilirubin, Direct: <0.1 mg/dL (ref 0.0–0.2)
Total Bilirubin: 0.7 mg/dL (ref 0.3–1.2)
Total Protein: 6.6 g/dL (ref 6.5–8.1)

## 2021-10-08 LAB — BRAIN NATRIURETIC PEPTIDE: B Natriuretic Peptide: 21.5 pg/mL (ref 0.0–100.0)

## 2021-10-08 MED ORDER — GUAIFENESIN ER 600 MG PO TB12
600.0000 mg | ORAL_TABLET | Freq: Two times a day (BID) | ORAL | Status: DC
  Administered 2021-10-08 – 2021-10-13 (×11): 600 mg via ORAL
  Filled 2021-10-08 (×11): qty 1

## 2021-10-08 MED ORDER — OXCARBAZEPINE 300 MG PO TABS
450.0000 mg | ORAL_TABLET | Freq: Two times a day (BID) | ORAL | Status: DC
  Administered 2021-10-08 – 2021-10-13 (×10): 450 mg via ORAL
  Filled 2021-10-08 (×12): qty 1

## 2021-10-08 MED ORDER — IPRATROPIUM-ALBUTEROL 0.5-2.5 (3) MG/3ML IN SOLN
3.0000 mL | Freq: Once | RESPIRATORY_TRACT | Status: AC
  Administered 2021-10-08: 3 mL via RESPIRATORY_TRACT
  Filled 2021-10-08: qty 3

## 2021-10-08 MED ORDER — IPRATROPIUM-ALBUTEROL 0.5-2.5 (3) MG/3ML IN SOLN
3.0000 mL | Freq: Four times a day (QID) | RESPIRATORY_TRACT | Status: DC
  Administered 2021-10-08 – 2021-10-09 (×4): 3 mL via RESPIRATORY_TRACT
  Filled 2021-10-08 (×2): qty 3

## 2021-10-08 MED ORDER — METHYLPREDNISOLONE SODIUM SUCC 125 MG IJ SOLR
60.0000 mg | INTRAMUSCULAR | Status: DC
  Administered 2021-10-09: 60 mg via INTRAVENOUS
  Filled 2021-10-08: qty 2

## 2021-10-08 MED ORDER — IPRATROPIUM-ALBUTEROL 0.5-2.5 (3) MG/3ML IN SOLN
3.0000 mL | Freq: Once | RESPIRATORY_TRACT | Status: DC
  Filled 2021-10-08: qty 3

## 2021-10-08 MED ORDER — FUROSEMIDE 10 MG/ML IJ SOLN
60.0000 mg | Freq: Once | INTRAMUSCULAR | Status: AC
  Administered 2021-10-08: 60 mg via INTRAVENOUS
  Filled 2021-10-08: qty 6

## 2021-10-08 MED ORDER — ACETAMINOPHEN 650 MG RE SUPP: 650.0000 mg | Freq: Four times a day (QID) | RECTAL | Status: DC | PRN

## 2021-10-08 MED ORDER — ASPIRIN EC 325 MG PO TBEC
325.0000 mg | DELAYED_RELEASE_TABLET | Freq: Every day | ORAL | Status: DC
  Administered 2021-10-08 – 2021-10-13 (×6): 325 mg via ORAL
  Filled 2021-10-08 (×6): qty 1

## 2021-10-08 MED ORDER — PREGABALIN 100 MG PO CAPS
300.0000 mg | ORAL_CAPSULE | Freq: Two times a day (BID) | ORAL | Status: DC
  Administered 2021-10-08 – 2021-10-13 (×10): 300 mg via ORAL
  Filled 2021-10-08 (×10): qty 3

## 2021-10-08 MED ORDER — ARFORMOTEROL TARTRATE 15 MCG/2ML IN NEBU
15.0000 ug | INHALATION_SOLUTION | Freq: Two times a day (BID) | RESPIRATORY_TRACT | Status: DC
  Administered 2021-10-08 – 2021-10-13 (×10): 15 ug via RESPIRATORY_TRACT
  Filled 2021-10-08 (×12): qty 2

## 2021-10-08 MED ORDER — ONDANSETRON HCL 4 MG/2ML IJ SOLN: 4.0000 mg | Freq: Four times a day (QID) | INTRAMUSCULAR | Status: DC | PRN

## 2021-10-08 MED ORDER — INSULIN GLARGINE-YFGN 100 UNIT/ML ~~LOC~~ SOLN
40.0000 [IU] | Freq: Every day | SUBCUTANEOUS | Status: DC
  Administered 2021-10-08: 40 [IU] via SUBCUTANEOUS
  Filled 2021-10-08 (×2): qty 0.4

## 2021-10-08 MED ORDER — SODIUM CHLORIDE 0.9 % IV SOLN
1.0000 g | INTRAVENOUS | Status: DC
  Administered 2021-10-08 – 2021-10-10 (×3): 1 g via INTRAVENOUS
  Filled 2021-10-08 (×3): qty 10

## 2021-10-08 MED ORDER — PRAMIPEXOLE DIHYDROCHLORIDE 0.25 MG PO TABS
0.5000 mg | ORAL_TABLET | Freq: Every day | ORAL | Status: DC
  Administered 2021-10-08 – 2021-10-12 (×5): 0.5 mg via ORAL
  Filled 2021-10-08 (×6): qty 2

## 2021-10-08 MED ORDER — METOPROLOL TARTRATE 25 MG PO TABS
25.0000 mg | ORAL_TABLET | Freq: Two times a day (BID) | ORAL | Status: DC
Start: 2021-10-08 — End: 2021-10-13
  Administered 2021-10-08 – 2021-10-13 (×10): 25 mg via ORAL
  Filled 2021-10-08 (×10): qty 1

## 2021-10-08 MED ORDER — METHYLPREDNISOLONE SODIUM SUCC 125 MG IJ SOLR
125.0000 mg | INTRAMUSCULAR | Status: AC
  Filled 2021-10-08: qty 2

## 2021-10-08 MED ORDER — ACETAMINOPHEN 325 MG PO TABS: 650.0000 mg | ORAL_TABLET | Freq: Four times a day (QID) | ORAL | Status: DC | PRN

## 2021-10-08 MED ORDER — INSULIN ASPART 100 UNIT/ML IJ SOLN
0.0000 [IU] | Freq: Three times a day (TID) | INTRAMUSCULAR | Status: DC
  Administered 2021-10-09: 11 [IU] via SUBCUTANEOUS
  Administered 2021-10-09: 5 [IU] via SUBCUTANEOUS
  Administered 2021-10-09 – 2021-10-10 (×3): 3 [IU] via SUBCUTANEOUS
  Administered 2021-10-10: 2 [IU] via SUBCUTANEOUS
  Administered 2021-10-11: 11 [IU] via SUBCUTANEOUS
  Administered 2021-10-11: 8 [IU] via SUBCUTANEOUS
  Administered 2021-10-11: 3 [IU] via SUBCUTANEOUS
  Administered 2021-10-12 – 2021-10-13 (×4): 8 [IU] via SUBCUTANEOUS
  Administered 2021-10-13: 5 [IU] via SUBCUTANEOUS

## 2021-10-08 MED ORDER — ALBUTEROL SULFATE (2.5 MG/3ML) 0.083% IN NEBU: 2.5000 mg | INHALATION_SOLUTION | RESPIRATORY_TRACT | Status: DC | PRN

## 2021-10-08 MED ORDER — ROSUVASTATIN CALCIUM 5 MG PO TABS
10.0000 mg | ORAL_TABLET | ORAL | Status: DC
  Administered 2021-10-11: 10 mg via ORAL
  Filled 2021-10-08 (×4): qty 2

## 2021-10-08 MED ORDER — BUDESONIDE 0.5 MG/2ML IN SUSP
0.5000 mg | Freq: Two times a day (BID) | RESPIRATORY_TRACT | Status: DC
  Administered 2021-10-08 – 2021-10-13 (×11): 0.5 mg via RESPIRATORY_TRACT
  Filled 2021-10-08 (×13): qty 2

## 2021-10-08 MED ORDER — PANTOPRAZOLE SODIUM 40 MG PO TBEC
40.0000 mg | DELAYED_RELEASE_TABLET | Freq: Every day | ORAL | Status: DC
  Administered 2021-10-08 – 2021-10-13 (×6): 40 mg via ORAL
  Filled 2021-10-08 (×6): qty 1

## 2021-10-08 MED ORDER — METHYLPREDNISOLONE SODIUM SUCC 125 MG IJ SOLR: 80.0000 mg | INTRAMUSCULAR | Status: DC

## 2021-10-08 MED ORDER — ENOXAPARIN SODIUM 40 MG/0.4ML IJ SOSY
40.0000 mg | PREFILLED_SYRINGE | INTRAMUSCULAR | Status: DC
  Administered 2021-10-08 – 2021-10-12 (×5): 40 mg via SUBCUTANEOUS
  Filled 2021-10-08 (×5): qty 0.4

## 2021-10-08 MED ORDER — PRAZOSIN HCL 2 MG PO CAPS
15.0000 mg | ORAL_CAPSULE | Freq: Every day | ORAL | Status: DC
  Administered 2021-10-09 – 2021-10-12 (×5): 15 mg via ORAL
  Filled 2021-10-08 (×7): qty 1

## 2021-10-08 MED ORDER — HYDRALAZINE HCL 20 MG/ML IJ SOLN: 10.0000 mg | INTRAMUSCULAR | Status: DC | PRN

## 2021-10-08 MED ORDER — ONDANSETRON HCL 4 MG PO TABS: 4.0000 mg | ORAL_TABLET | Freq: Four times a day (QID) | ORAL | Status: DC | PRN

## 2021-10-08 NOTE — ED Notes (Signed)
Pt ambulated to restroom independently with minimal assistance, O2 maintained at 92% and higher. Patient became short of breath and stated that he coughed and now he was fine.

## 2021-10-08 NOTE — H&P (Addendum)
History and Physical    Vincent Richmond [id number] DOB: 1945-11-14 DOA: 10/07/2021  Referring MD/NP/PA: Genevive Bi, PA-C PCP: [name]  Patient coming from: Home  Chief Complaint: Shortness of breath  I have personally briefly reviewed patient's old medical records in [place]   HPI: Vincent Richmond is a 76 y.o. male with medical history significant of hypertension, COPD, diabetes mellitus type 2, OSA, and obesity presents with complaints of progressively worsening shortness of breath over the last 2 weeks.  He had just recently been hospitalized 09/13/2021-09/18/2021 with a asthma-COPD overlap syndrome with acute exacerbation.  Chest x-ray at that noted cardiomegaly without acute process.  At that time patient was not seen to have any concern for fluid overload.  Patient was treated with IV steroids, DuoNebs, azithromycin, and Mucinex with improvement in symptoms.  He was transitioned to a Medrol Dosepak and was able to be discharged home.  Patient reported doing well up until he completed the Medrol dose pack.  He reports symptoms of cough and wheezing returned.  After getting home he had called the [place], but was not set up for follow-up appointment until January 12.  His cough has been intermittently productive with mostly clear sputum, but notes seeing orange particles that he can mush in his sputum that he does not think is blood.  Associated symptoms include leg swelling, orthopnea, and paroxysmal nocturnal dyspnea.  States that he has been sitting sitting up for the last week due to coughing and feeling like he cannot breathe anytime he tries to lay down.  He has been using his inhalers as well as albuterol nebs every 4 hours until he ran out last night.  His wife makes note that the patient has had prior agent orange exposure in addition to a remote history of tobacco use.  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   ED Course: Upon admission into the emergency department patient was seen to be afebrile, respiration 15-25, blood pressure 137/62-180 2/61, and O2 saturations noted to be as low as 91% placed on 2 L of nasal cannula oxygen due to respiratory distress.  Labs since 1/2 significant for WBC 11.2, sodium 134, glucose 205, BNP 21.5, and high-sensitivity troponins negative x2.  Chest x-ray noted possible mild pulmonary edema.  Influenza and COVID-19 screening was negative.  Patient had been given Solu-Medrol 125 mg x2 doses, multiple DuoNeb breathing treatments, and Lasix 60 mg IV.  Review of Systems  Constitutional:  Negative for fever and malaise/fatigue.  HENT:  Negative for congestion and nosebleeds.   Eyes:  Negative for photophobia and pain.  Respiratory:  Positive for cough, sputum production, shortness of breath and wheezing.   Cardiovascular:  Positive for orthopnea, leg swelling and PND. Negative for chest pain.  Gastrointestinal:  Negative for abdominal pain, diarrhea and vomiting.  Genitourinary:  Negative for dysuria and hematuria.  Musculoskeletal:  Negative for back pain and neck pain.  Skin:  Negative for rash.  Neurological:  Negative for focal weakness and loss of consciousness.  Psychiatric/Behavioral:  Negative for substance abuse.   Otherwise a complete review of system was negative except for as  noted above in the HPI.  Past Medical History:  Diagnosis Date   Diabetes mellitus without complication ([place])    Hypertension    PTSD (post-traumatic stress disorder)    Stroke [facility])     Past  Surgical History:  Procedure Laterality Date   TONSILLECTOMY     As A child     reports that he quit smoking about 46 years ago. His smoking use included cigarettes. He started smoking about 67 years ago. He has a 21.00 pack-year smoking history. He has never used smokeless tobacco. He reports that he does not drink alcohol and does not use  drugs.  Allergies  Allergen Reactions   Pregabalin Diarrhea    Pt takes this at home daily   Statins Other (See Comments)    Severe muscle pains    Family History  Problem Relation Age of Onset   Heart disease Father    Cancer Father     Prior to Admission medications   Medication Sig Start Date End Date Taking? Authorizing Provider  albuterol (VENTOLIN HFA) 108 (90 Base) MCG/ACT inhaler Inhale 2 puffs into the lungs every 6 (six) hours as needed for wheezing or shortness of breath. 11/23/19   Leslye PeerByrum, Robert S, MD  ALPHA LIPOIC ACID PO Take 1 tablet by mouth daily.    [provider]  Ascorbic Acid (VITAMIN C PO) Take 1,000 mg by mouth daily.    [provider]  aspirin EC 325 MG tablet Take 325 mg by mouth daily.    [provider]  benzonatate (TESSALON) 100 MG capsule Take 1 capsule (100 mg total) by mouth 3 (three) times daily as needed for up to 20 doses for cough. 09/18/21   Lanae BoastKc, Ramesh, MD  budesonide-formoterol (SYMBICORT) 160-4.5 MCG/ACT inhaler Inhale 2 puffs into the lungs 2 (two) times daily. 09/18/21 10/18/21  Lanae BoastKc, Ramesh, MD  buPROPion (WELLBUTRIN XL) 300 MG 24 hr tablet Take 300 mg by mouth daily.    [provider]  cholecalciferol (VITAMIN D) 1000 UNITS tablet Take 1,000 Units by mouth daily.    [provider]  COLLAGEN PO Take 1 capsule by mouth daily.    [provider]  FLUoxetine (PROZAC) 20 MG capsule Take 80 mg by mouth daily. For depression and anxiety -4 tabs in am    [provider]  guaiFENesin (DIABETIC TUSSIN PO) Take 10 mLs by mouth 2 (two) times daily as needed (cough).    [provider]  insulin glargine (LANTUS) 100 unit/mL SOPN Inject 65 Units into the skin at bedtime.    [provider]  Ipratropium-Albuterol (COMBIVENT RESPIMAT IN) Inhale 1 puff into the lungs 4 (four) times daily.    [provider]  loperamide (IMODIUM) 2 MG capsule Take 4 mg by mouth daily.      [provider]  metFORMIN (GLUCOPHAGE) 1000 MG tablet Take 1,000 mg by mouth 2 (two) times daily with a meal.    [provider]  methylPREDNISolone (MEDROL DOSEPAK) 4 MG TBPK tablet 6,5,4,3,2,1 09/18/21   Lanae BoastKc, Ramesh, MD  metoprolol tartrate (LOPRESSOR) 25 MG tablet Take 1 tablet (25 mg total) by mouth 2 (two) times daily. 11/01/18   Ghimire, Werner LeanShanker M, MD  modafinil (PROVIGIL) 100 MG tablet Take 100 mg by mouth 3 (three) times daily.    [provider]  Multiple Vitamins-Minerals (CENTRUM SILVER ADULT 50+ PO) Take 1 tablet by mouth daily.    [provider]  NON FORMULARY CPAP Machine daily    [provider]  Omega-3 Fatty Acids (FISH OIL) 1000 MG CAPS Take 1,000 mg by mouth daily.     [provider]  omeprazole (PRILOSEC) 20 MG capsule Take 20 mg by mouth daily.  [provider]  OVER THE COUNTER MEDICATION Take 1 tablet by mouth daily. Tumeric curcumin    [provider]  pramipexole (MIRAPEX) 0.5 MG tablet Take 1 tablet (0.5 mg total) by mouth at bedtime. 09/18/21   Antonieta Pert, MD  prazosin (MINIPRESS) 5 MG capsule Take 15 mg by mouth at bedtime. For nightmares and flashbacks    [provider]  pregabalin (LYRICA) 300 MG capsule Take 300 mg by mouth 2 (two) times daily.    [provider]  Probiotic Product (PROBIOTIC DAILY PO) Take 1 capsule by mouth daily.    [provider]    Physical Exam:  Constitutional: Elderly male currently in no acute distress sitting upright Vitals:   10/08/21 1000 10/08/21 1015 10/08/21 1030 10/08/21 1045  BP: (!) 163/61 (!) 149/62 (!) 153/67 (!) 178/76  Pulse: 87 88 88 95  Resp: 20 18 (!) 21 (!) 22  Temp:      TempSrc:      SpO2: 96% 93% 93% 94%   Eyes: PERRL, lids and conjunctivae normal ENMT: Mucous membranes are moist. Posterior pharynx clear of any exudate or lesions.  Neck: normal, supple, no masses, no thyromegaly.  Mild JVD  appreciated. Respiratory: Bilateral wheezing heard throughout lung fields.  O2 saturation currently maintained on room air. Cardiovascular: Regular rate and rhythm.  1+ pitting bilateral lower edema. 2+ pedal pulses. No carotid bruits.  Abdomen: Protuberant abdomen, no masses palpated. No hepatosplenomegaly. Bowel sounds positive.  Musculoskeletal: no clubbing / cyanosis. Normal muscle tone.  Skin: no rashes, lesions, ulcers. No induration Neurologic: CN 2-12 grossly intact.  Strength 5/5 in all 4.  Psychiatric:  Alert and oriented x 3. Normal mood.     Labs on Admission: I have personally reviewed following labs and imaging studies  CBC: Recent Labs  Lab 10/07/21 2111  WBC 11.2*  NEUTROABS 7.9*  HGB 13.0  HCT 39.6  MCV 88.8  PLT [id number]   Basic Metabolic Panel: Recent Labs  Lab 10/07/21 2111  NA 134*  K 4.3  CL 98  CO2 27  GLUCOSE 205*  BUN 10  CREATININE 0.82  CALCIUM 8.9   GFR: CrCl cannot be calculated (Unknown ideal weight.). Liver Function Tests: Recent Labs  Lab 10/08/21 0511  AST 17  ALT 19  ALKPHOS 67  BILITOT 0.7  PROT 6.6  ALBUMIN 3.7   No results for input(s): LIPASE, AMYLASE in the last 168 hours. No results for input(s): AMMONIA in the last 168 hours. Coagulation Profile: No results for input(s): INR, PROTIME in the last 168 hours. Cardiac Enzymes: No results for input(s): CKTOTAL, CKMB, CKMBINDEX, TROPONINI in the last 168 hours. BNP (last 3 results) No results for input(s): PROBNP in the last 8760 hours. HbA1C: No results for input(s): HGBA1C in the last 72 hours. CBG: No results for input(s): GLUCAP in the last 168 hours. Lipid Profile: No results for input(s): CHOL, HDL, LDLCALC, TRIG, CHOLHDL, LDLDIRECT in the last 72 hours. Thyroid Function Tests: No results for input(s): TSH, T4TOTAL, FREET4, T3FREE, THYROIDAB in the last 72 hours. Anemia Panel: No results for input(s): VITAMINB12, FOLATE, FERRITIN, TIBC, IRON, RETICCTPCT in the last  72 hours. Urine analysis: No results found for: COLORURINE, APPEARANCEUR, LABSPEC, PHURINE, GLUCOSEU, HGBUR, BILIRUBINUR, KETONESUR, PROTEINUR, UROBILINOGEN, NITRITE, LEUKOCYTESUR Sepsis Labs: Recent Results (from the past 240 hour(s))  Resp Panel by RT-PCR (Flu A&B, Covid) Nasopharyngeal Swab     Status: None   Collection Time: 10/08/21  5:01 AM   Specimen: Nasopharyngeal Swab;  Nasopharyngeal(NP) swabs in vial transport medium  Result Value Ref Range Status   SARS Coronavirus 2 by RT PCR NEGATIVE NEGATIVE Final    Comment: (NOTE) SARS-CoV-2 target nucleic acids are NOT DETECTED.  The SARS-CoV-2 RNA is generally detectable in upper respiratory specimens during the acute phase of infection. The lowest concentration of SARS-CoV-2 viral copies this assay can detect is 138 copies/mL. A negative result does not preclude SARS-Cov-2 infection and should not be used as the sole basis for treatment or other patient management decisions. A negative result may occur with  improper specimen collection/handling, submission of specimen other than nasopharyngeal swab, presence of viral mutation(s) within the areas targeted by this assay, and inadequate number of viral copies(<138 copies/mL). A negative result must be combined with clinical observations, patient history, and epidemiological information. The expected result is Negative.  Fact Sheet for Patients:  BloggerCourse.com  Fact Sheet for Healthcare Providers:  SeriousBroker.it  This test is no t yet approved or cleared by the [place] FDA and  has been authorized for detection and/or diagnosis of SARS-CoV-2 by FDA under an Emergency Use Authorization (EUA). This EUA will remain  in effect (meaning this test can be used) for the duration of the COVID-19 declaration under Section 564(b)(1) of the Act, 21 U.S.C.section 360bbb-3(b)(1), unless the authorization is terminated  or revoked  sooner.       Influenza A by PCR NEGATIVE NEGATIVE Final   Influenza B by PCR NEGATIVE NEGATIVE Final    Comment: (NOTE) The Xpert Xpress SARS-CoV-2/FLU/RSV plus assay is intended as an aid in the diagnosis of influenza from Nasopharyngeal swab specimens and should not be used as a sole basis for treatment. Nasal washings and aspirates are unacceptable for Xpert Xpress SARS-CoV-2/FLU/RSV testing.  Fact Sheet for Patients: BloggerCourse.com  Fact Sheet for Healthcare Providers: SeriousBroker.it  This test is not yet approved or cleared by the [place] FDA and has been authorized for detection and/or diagnosis of SARS-CoV-2 by FDA under an Emergency Use Authorization (EUA). This EUA will remain in effect (meaning this test can be used) for the duration of the COVID-19 declaration under Section 564(b)(1) of the Act, 21 U.S.C. section 360bbb-3(b)(1), unless the authorization is terminated or revoked.  Performed at [facility], [address], [place], [place] [address]      Radiological Exams on Admission: DG Chest 2 View  Result Date: 10/07/2021 CLINICAL DATA:  shortness of breath EXAM: CHEST - 2 VIEW COMPARISON:  Chest x-ray 09/14/2021 FINDINGS: The heart and mediastinal contours are unchanged. Slightly prominent hilar vasculature. No focal consolidation. Slightly increased interstitial markings. No pleural effusion. No pneumothorax. No acute osseous abnormality. IMPRESSION: Possible mild pulmonary edema. Electronically Signed   By: Tish Frederickson M.D.   On: 10/07/2021 21:38    EKG: Independently reviewed.  Sinus rhythm at 93 bpm with first-degree heart block  Assessment/Plan Acute respiratory distress: Patient presents with complaints of worsening shortness of breath.  Not documented to be hypoxic on room air, but was placed on 2 L nasal cannula oxygen due to work of breathing.  Chest x-ray concerning for mild  pulmonary edema and patient noted to be wheezing with lower extremity edema.  Suspect multifactorial in nature secondary to CHF exacerbation and/or COPD exacerbation. 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  Patient presents back to hospital with complaints of productive cough and  wheezing after completion of steroid taper. Previously diagnosed by pulmonology couple years ago with asthma-COPD overlaps syndrome. Wife noted prior history agent orange exposure and remote history of tobacco. -COPD order set utilized -DuoNebs 4 times daily and.  Overall breathing treatments as needed for wheezing or shortness of breath -B[place] and budesonide nebs substitution for fluticasone-salmeterol -Solu-Medrol 60 mg IV daily transition to po when medically appropriate -Empiric antibiotics of Rocephin IV -Check CT scan of the chest given prior exposure history including agent orange  Diastolic congestive heart failure exacerbation: Suspected acute.  Patient has been noted to have enlarged heart on previous chest x-ray from last month, but showed no signs of volume overload at that time.  Patient currently has 1+ pitting edema.  BNP 21.5, but patient is  obese.  He had been given Lasix 60 mg IV x1 dose.  Patient reported urinating a significant amount, but was not accurately recorded and initial weight obtained prior to Lasix being given. -Strict I&Os and daily weights -Check echocardiogram -Reassess in a.m. and determine if need of further IV diuresis or transition to p.o.  -Consider need of formal consult to cardiology in a.m.  Leukocytosis: WBC was 11.2, but had been 15.2 prior to being discharged on 09/16/21.  Patient denies having any complaints of fever. Suspect 2/2 to above. -Continue to monitor  Diabetes mellitus type 2 with neuropathy, uncontrolled:  Hemoglobin A1c was 7.8.  Home insulin regimen included Semglee 50 units nightly, ozempic 0.5 mg q wk, and metformin 1000 mg twice daily. -Hypoglycemic protocols -Hold metformin -Continue Semglee at 40 units qHS -CBGs qAC with moderate SSI  Essential hypertension: Blood pressure initially elevated up to 182/61, but patient was not initially restarted home blood pressure medications.  Home regimen includes metoprolol 25 mg twice daily. -Resume home regimen -Hydralazine IV as needed for elevated blood pressures  PTSD -Continue prazosin  HLD -Continue Crestor  Gerd -Continue pharmacy substitution for Protonix   OSA -Continue CPAP nightly  DVT prophylaxis: lovenox Code Status: Full Family Communication: wife  updated over the phone Disposition Plan: Hopefully discharge home once medically stable Consults called: none Admission status: Inpatient require more than 2 midnight stay  Norval Morton MD [facility]   If 7PM-7AM, please contact night-coverage   10/08/2021, 11:03 AM

## 2021-10-08 NOTE — ED Notes (Signed)
Pts son took him to bathroom and pt returned extremely short of breath. Pt placed back on [place], triage RN and PA made aware. Pt taken back to triage for reevaluation.

## 2021-10-08 NOTE — ED Notes (Signed)
Pt O2 sat 92% and pt complaining of shortness of breath, placed on 2L [place] improved to 95%.

## 2021-10-08 NOTE — ED Provider Notes (Signed)
[facility] EMERGENCY DEPARTMENT Provider Note   CSN: [id number] Arrival date & time: 10/07/21  2023     History  Chief Complaint  Patient presents with   Shortness of Breath    Vincent Richmond is a 76 y.o. male with history of COPD bronchitis, diabetes type 2, stroke.  Patient presents to ED due to 2 to 3 weeks of shortness of breath and coughing.  Patient was discharged from this facility on 12/14 with a diagnosis of acute asthma-COPD overlap syndrome with exacerbation. Patient has been admitted previously for this same issue back in 2020. Patient states that since he was discharged on 12/14, he had mild improvement in symptoms while using Solu-medrol dosepack. Patient states that when his solu-medrol dosepack ran out, his symptoms returned. He states that when his symptoms returned, he presented to [facility] where he received an albuterol nebulizer. Patient states that at home he was using his albuterol nebulizer every 4 hours with mild relief of symptoms however symptoms always returned after 4 hours. Patient has home CPAP machine but has not been wearing it "the last 3-4 nights" due to his coughing. Patient reports he worked at an [employer] "around [employer]" for much of his life. 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 Patient endorses SOB, wheezing, cough with sputum production, lower extremity swelling, body aches. Patient denies fevers, chest pain, nausea, vomiting, diarrhea.    Shortness of Breath Associated symptoms: cough and wheezing   Associated symptoms: no abdominal pain, no chest pain, no fever, no sore throat and no vomiting       Home Medications Prior to Admission medications   Medication Sig Start Date End Date Taking? Authorizing Provider  albuterol (VENTOLIN HFA) 108 (90 Base) MCG/ACT inhaler Inhale 2 puffs into the lungs every 6 (six) hours as needed for wheezing or shortness of breath. 11/23/19    Leslye Peer, MD  ALPHA LIPOIC ACID PO Take 1 tablet by mouth daily.    [provider]  Ascorbic Acid (VITAMIN C PO) Take 1,000 mg by mouth daily.    [provider]  aspirin EC 325 MG tablet Take 325 mg by mouth daily.    [provider]  benzonatate (TESSALON) 100 MG capsule Take 1 capsule (100 mg total) by mouth 3 (three) times daily as needed for up to 20 doses for cough. 09/18/21   Lanae Boast, MD  budesonide-formoterol (SYMBICORT) 160-4.5 MCG/ACT inhaler Inhale 2 puffs into the lungs 2 (two) times daily. 09/18/21 10/18/21  Lanae Boast, MD  buPROPion (WELLBUTRIN XL) 300 MG 24 hr tablet Take 300 mg by mouth daily.    [provider]  cholecalciferol (VITAMIN D) 1000 UNITS tablet Take 1,000 Units by mouth daily.    [provider]  COLLAGEN PO Take 1 capsule by mouth daily.    [provider]  FLUoxetine (PROZAC) 20 MG capsule Take 80 mg by mouth daily. For depression and anxiety -4 tabs in am    [provider]  guaiFENesin (DIABETIC TUSSIN PO) Take 10 mLs by mouth 2 (two) times daily as needed (cough).    [provider]  insulin glargine (LANTUS) 100 unit/mL SOPN Inject 65 Units into the skin at bedtime.    [provider]  Ipratropium-Albuterol (COMBIVENT RESPIMAT IN) Inhale 1 puff into the lungs 4 (four) times daily.    [provider]  loperamide (IMODIUM) 2 MG capsule Take 4 mg by  mouth daily.     [provider]  metFORMIN (GLUCOPHAGE) 1000 MG tablet Take 1,000 mg by mouth 2 (two) times daily with a meal.    [provider]  methylPREDNISolone (MEDROL DOSEPAK) 4 MG TBPK tablet 6,5,4,3,2,1 09/18/21   Antonieta Pert, MD  metoprolol tartrate (LOPRESSOR) 25 MG tablet Take 1 tablet (25 mg total) by mouth 2 (two) times daily. 11/01/18   Ghimire, Henreitta Leber, MD  modafinil (PROVIGIL) 100 MG tablet Take 100 mg by mouth 3 (three) times daily.    [provider]  Multiple  Vitamins-Minerals (CENTRUM SILVER ADULT 50+ PO) Take 1 tablet by mouth daily.    [provider]  NON FORMULARY CPAP Machine daily    [provider]  Omega-3 Fatty Acids (FISH OIL) 1000 MG CAPS Take 1,000 mg by mouth daily.     [provider]  omeprazole (PRILOSEC) 20 MG capsule Take 20 mg by mouth daily.    [provider]  OVER THE COUNTER MEDICATION Take 1 tablet by mouth daily. Tumeric curcumin    [provider]  pramipexole (MIRAPEX) 0.5 MG tablet Take 1 tablet (0.5 mg total) by mouth at bedtime. 09/18/21   Antonieta Pert, MD  prazosin (MINIPRESS) 5 MG capsule Take 15 mg by mouth at bedtime. For nightmares and flashbacks    [provider]  pregabalin (LYRICA) 300 MG capsule Take 300 mg by mouth 2 (two) times daily.    [provider]  Probiotic Product (PROBIOTIC DAILY PO) Take 1 capsule by mouth daily.    [provider]      Allergies    Pregabalin and Statins    Review of Systems   Review of Systems  Constitutional:  Negative for chills and fever.  HENT:  Negative for sore throat.   Respiratory:  Positive for cough, shortness of breath and wheezing.   Cardiovascular:  Positive for leg swelling. Negative for chest pain.  Gastrointestinal:  Negative for abdominal pain, diarrhea, nausea and vomiting.  Musculoskeletal:  Positive for myalgias.  All other systems reviewed and are negative.  Physical Exam Updated Vital Signs BP (!) 178/76    Pulse 95    Temp 98.4 F (36.9 C) (Oral)    Resp (!) 22    SpO2 94%  Physical Exam Vitals and nursing note reviewed.  Constitutional:      General: He is not in acute distress.    Appearance: He is not ill-appearing or toxic-appearing.  HENT:     Head: Normocephalic.     Mouth/Throat:     Mouth: Mucous membranes are moist.  Eyes:     Extraocular Movements: Extraocular movements intact.     Pupils: Pupils are equal, round, and reactive to light.  Neck:     Vascular:  No JVD.  Cardiovascular:     Rate and Rhythm: Normal rate and regular rhythm.  Pulmonary:     Effort: Pulmonary effort is normal. No respiratory distress.     Breath sounds: Examination of the right-upper field reveals wheezing. Examination of the left-upper field reveals wheezing. Examination of the right-middle field reveals wheezing. Examination of the left-middle field reveals wheezing. Wheezing present.  Abdominal:     General: Bowel sounds are normal.     Palpations: Abdomen is soft. There is no hepatomegaly or splenomegaly.     Tenderness: There is no abdominal tenderness.  Musculoskeletal:     Cervical back: Normal range of motion.     Right lower leg: Edema present.  Left lower leg: Edema present.  Skin:    General: Skin is warm and dry.     Capillary Refill: Capillary refill takes less than 2 seconds.  Neurological:     General: No focal deficit present.     Mental Status: He is alert.    ED Results / Procedures / Treatments   Labs (all labs ordered are listed, but only abnormal results are displayed) Labs Reviewed  BASIC METABOLIC PANEL - Abnormal; Notable for the following components:      Result Value   Sodium 134 (*)    Glucose, Bld 205 (*)    All other components within normal limits  CBC WITH DIFFERENTIAL/PLATELET - Abnormal; Notable for the following components:   WBC 11.2 (*)    Neutro Abs 7.9 (*)    Eosinophils Absolute 1.0 (*)    Abs Immature Granulocytes 0.11 (*)    All other components within normal limits  RESP PANEL BY RT-PCR (FLU A&B, COVID) ARPGX2  BRAIN NATRIURETIC PEPTIDE  HEPATIC FUNCTION PANEL  TROPONIN I (HIGH SENSITIVITY)  TROPONIN I (HIGH SENSITIVITY)    EKG EKG Interpretation  Date/Time:  Monday October 07 2021 20:33:27 EST Ventricular Rate:  93 PR Interval:  220 QRS Duration: 84 QT Interval:  364 QTC Calculation: 452 R Axis:   -17 Text Interpretation: Sinus rhythm with 1st degree A-V block with occasional Premature  ventricular complexes Otherwise normal ECG When compared with ECG of 13-Sep-2021 23:43, PREVIOUS ECG IS PRESENT since last tracing no significant change Confirmed by Malvin Johns [phone]) on 10/08/2021 10:08:58 AM  Radiology DG Chest 2 View  Result Date: 10/07/2021 CLINICAL DATA:  shortness of breath EXAM: CHEST - 2 VIEW COMPARISON:  Chest x-ray 09/14/2021 FINDINGS: The heart and mediastinal contours are unchanged. Slightly prominent hilar vasculature. No focal consolidation. Slightly increased interstitial markings. No pleural effusion. No pneumothorax. No acute osseous abnormality. IMPRESSION: Possible mild pulmonary edema. Electronically Signed   By: Iven Finn M.D.   On: 10/07/2021 21:38    Procedures Procedures    Medications Ordered in ED Medications  methylPREDNISolone sodium succinate (SOLU-MEDROL) 125 mg/2 mL injection 125 mg (125 mg Intravenous Not Given 10/08/21 0835)  enoxaparin (LOVENOX) injection 40 mg (has no administration in time range)  acetaminophen (TYLENOL) tablet 650 mg (has no administration in time range)    Or  acetaminophen (TYLENOL) suppository 650 mg (has no administration in time range)  ondansetron (ZOFRAN) tablet 4 mg (has no administration in time range)    Or  ondansetron (ZOFRAN) injection 4 mg (has no administration in time range)  guaiFENesin (MUCINEX) 12 hr tablet 600 mg (has no administration in time range)  ipratropium-albuterol (DUONEB) 0.5-2.5 (3) MG/3ML nebulizer solution 3 mL (has no administration in time range)  albuterol (PROVENTIL) (2.5 MG/3ML) 0.083% nebulizer solution 2.5 mg (has no administration in time range)  arformoterol (BROVANA) nebulizer solution 15 mcg (has no administration in time range)  budesonide (PULMICORT) nebulizer solution 0.5 mg (has no administration in time range)  cefTRIAXone (ROCEPHIN) 1 g in sodium chloride 0.9 % 100 mL IVPB (has no administration in time range)  methylPREDNISolone sodium succinate (SOLU-MEDROL) 125  mg/2 mL injection 125 mg (125 mg Intravenous Given 10/08/21 0829)  ipratropium-albuterol (DUONEB) 0.5-2.5 (3) MG/3ML nebulizer solution 3 mL (3 mLs Nebulization Given 10/08/21 0831)  ipratropium-albuterol (DUONEB) 0.5-2.5 (3) MG/3ML nebulizer solution 3 mL (3 mLs Nebulization Given 10/08/21 0942)  furosemide (LASIX) injection 60 mg (60 mg Intravenous Given 10/08/21 1026)    ED Course/ Medical  Decision Making/ A&P                           Medical Decision Making  76 year old male presents with wheezing, shortness of breath for the last "2 to 3 weeks".  Patient has history of the same which have required admission in the past.  Patient has been treated with 2 rounds of DuoNeb and 125 mg of Solu-Medrol.  Even after these interventions, wheezing is still persistent and profuse throughout.  Chest x-ray shows signs of pulmonary edema patient also has 2+ bilateral pitting edema to lower extremities.  BNP within normal limits.  Patient given 60 mg of Lasix for this.  Also of note, patient has new oxygen requirement.  On room air, patient sats around 90%.  On 3 L of oxygen via nasal cannula, patient oxygen saturations improved to 96 to 97%.  Patient states that when he gets up to use the bathroom, he feels very short of breath off of oxygen.  Despite our interventions, patient still has pronounced wheezing on examination.  I feel it is necessary to admit this patient for COPD exacerbation so that patient can have a further work-up to include CT scan of chest as well as echocardiogram.  Dr. Jeanell Sparrow in agreement with plan.  Patient case discussed with Dr. Tamala Julian of [facility] who agrees to see and admit patient. Patient made aware of current plan and is in agreement with plan for admission.  Final Clinical Impression(s) / ED Diagnoses Final diagnoses:  COPD exacerbation [facility])    Rx / DC Orders ED Discharge Orders     None         Azucena Cecil, PA-C 10/08/21 1133    Pattricia Boss, MD 10/08/21  [phone]

## 2021-10-08 NOTE — Progress Notes (Signed)
*  PRELIMINARY RESULTS* Echocardiogram 2D Echocardiogram has been performed.  Carolyne Fiscal 10/08/2021, 2:01 PM

## 2021-10-09 DIAGNOSIS — R0603 Acute respiratory distress: Secondary | ICD-10-CM | POA: Diagnosis not present

## 2021-10-09 LAB — GLUCOSE, CAPILLARY
Glucose-Capillary: 189 mg/dL — ABNORMAL HIGH (ref 70–99)
Glucose-Capillary: 239 mg/dL — ABNORMAL HIGH (ref 70–99)
Glucose-Capillary: 341 mg/dL — ABNORMAL HIGH (ref 70–99)
Glucose-Capillary: 229 mg/dL — ABNORMAL HIGH (ref 70–99)

## 2021-10-09 LAB — BASIC METABOLIC PANEL
Anion gap: 12 (ref 5–15)
BUN: 11 mg/dL (ref 8–23)
CO2: 26 mmol/L (ref 22–32)
Calcium: 9.3 mg/dL (ref 8.9–10.3)
Chloride: 98 mmol/L (ref 98–111)
Creatinine, Ser: 0.85 mg/dL (ref 0.61–1.24)
GFR, Estimated: >60 mL/min (ref >60)
Glucose, Bld: 179 mg/dL — ABNORMAL HIGH (ref 70–99)
Potassium: 3.8 mmol/L (ref 3.5–5.1)
Sodium: 136 mmol/L (ref 135–145)

## 2021-10-09 LAB — CBC
HCT: 39.6 % (ref 39.0–52.0)
Hemoglobin: 13.7 g/dL (ref 13.0–17.0)
MCH: 29.9 pg (ref 26.0–34.0)
MCHC: 34.6 g/dL (ref 30.0–36.0)
MCV: 86.5 fL (ref 80.0–100.0)
Platelets: 345 10*3/uL (ref 150–400)
RBC: 4.58 MIL/uL (ref 4.22–5.81)
RDW: 13.6 % (ref 11.5–15.5)
WBC: 13.5 10*3/uL — ABNORMAL HIGH (ref 4.0–10.5)
nRBC: 0 % (ref 0.0–0.2)

## 2021-10-09 MED ORDER — INSULIN ASPART 100 UNIT/ML IJ SOLN
3.0000 [IU] | Freq: Three times a day (TID) | INTRAMUSCULAR | Status: DC
  Administered 2021-10-09 – 2021-10-11 (×5): 3 [IU] via SUBCUTANEOUS

## 2021-10-09 MED ORDER — AZITHROMYCIN 250 MG PO TABS
500.0000 mg | ORAL_TABLET | Freq: Every day | ORAL | Status: DC
  Administered 2021-10-09 – 2021-10-11 (×3): 500 mg via ORAL
  Filled 2021-10-09 (×3): qty 2

## 2021-10-09 MED ORDER — INSULIN GLARGINE-YFGN 100 UNIT/ML ~~LOC~~ SOLN
60.0000 [IU] | Freq: Every day | SUBCUTANEOUS | Status: DC
  Administered 2021-10-09 – 2021-10-11 (×3): 60 [IU] via SUBCUTANEOUS
  Filled 2021-10-09 (×5): qty 0.6

## 2021-10-09 MED ORDER — IPRATROPIUM-ALBUTEROL 0.5-2.5 (3) MG/3ML IN SOLN
3.0000 mL | Freq: Three times a day (TID) | RESPIRATORY_TRACT | Status: DC
  Administered 2021-10-09 – 2021-10-12 (×7): 3 mL via RESPIRATORY_TRACT
  Filled 2021-10-09 (×8): qty 3

## 2021-10-09 MED ORDER — METHYLPREDNISOLONE SODIUM SUCC 125 MG IJ SOLR
120.0000 mg | INTRAMUSCULAR | Status: DC
  Administered 2021-10-10: 120 mg via INTRAVENOUS
  Filled 2021-10-09: qty 2

## 2021-10-09 MED ORDER — POTASSIUM CHLORIDE CRYS ER 20 MEQ PO TBCR
40.0000 meq | EXTENDED_RELEASE_TABLET | Freq: Once | ORAL | Status: AC
  Administered 2021-10-09: 40 meq via ORAL
  Filled 2021-10-09: qty 2

## 2021-10-09 MED ORDER — FUROSEMIDE 10 MG/ML IJ SOLN
40.0000 mg | Freq: Once | INTRAMUSCULAR | Status: AC
  Administered 2021-10-09: 40 mg via INTRAVENOUS
  Filled 2021-10-09: qty 4

## 2021-10-09 NOTE — Progress Notes (Signed)
Mobility Specialist Progress Note:   10/09/21 1707  Mobility  Activity Ambulated in room;Ambulated to bathroom  Level of Assistance Modified independent, requires aide device or extra time  Assistive Device Four wheel walker  Distance Ambulated (ft) 40 ft  Mobility Ambulated with assistance in room;Out of bed for toileting  Mobility Response Tolerated well  Mobility performed by Mobility specialist  Bed Position Chair  $Mobility charge 1 Mobility   No complaints of pain. Pt left in chair with call bell in reach and all needs met.   [facility] Public librarian Phone [phone] Secondary Phone [phone]

## 2021-10-09 NOTE — Progress Notes (Signed)
Heart Failure Nurse Navigator Progress Note  Following this hospitalization to assess for HV TOC needs. Pt receiving 2 doses IV lasix this admission. Nebs and solumedrol. [facility].   ECHO: EF 65-70%, mild LVH.   No plans for HV TOC clinic at this time.  Pricilla Holm, MSN, RN Heart Failure Nurse Navigator [phone]

## 2021-10-09 NOTE — Progress Notes (Signed)
PROGRESS NOTE    Vincent Richmond  PVX:[id number] DOB: 1946-07-01 DOA: 10/07/2021 PCP: [facility]  Brief Narrative: 76/M with history of prior tobacco abuse, type 2 diabetes mellitus, OSA, obesity, hypertension, was recently hospitalized with asthma/COPD exacerbation, at that time treated with steroids duo nebs antibiotics etc. and discharged home on Medrol Dosepak, did well initially, after which he had recurrence of cough wheezing and shortness of breath and hence presented back to the ER. -Complains of productive cough, wheezing, also noticed some leg swelling and PND. -In the ED he was noted to be hypoxic to 91%, placed on 2 L O2 for distress, labs noted BNP of 21, troponin was negative, chest x-ray noted possible pulmonary edema, influenza and COVID PCR were negative   Assessment & Plan:   Acute hypoxic respiratory failure Asthma/COPD overlap syndrome with exacerbation Community-acquired pneumonia -CT chest with bilateral airspace disease, COVID and influenza PCR negative -Continue IV ceftriaxone, add azithromycin -Clinically does not appear volume overloaded at this time, Lasix x1 dose only today -Continue IV steroids 60 mg every 12 -DuoNebs  Acute diastolic CHF -Had trace edema on admission yesterday, resolved now, clinically does not appear fluid overloaded -Follow-up echocardiogram, Lasix x1 today  Type 2 diabetes mellitus -Continue home regimen of Semglee, increased dose to 60 units, also on Ozempic and metformin at baseline -Add NovoLog with meals  Essential hypertension -Continue metoprolol  PTSD -Continue prazosin  OSA -Continue CPAP nightly    DVT prophylaxis: Lovenox Code Status: Full code Family Communication: Discussed with patient in detail, no family at bedside Disposition Plan:  Status is: Inpatient  Remains inpatient appropriate because: Severity of illness   Consultants:    Procedures:   Antimicrobials:    Subjective: -Breathing  improving, complains of cough congestion, wheezing and shortness of breath for 5 to 6 days  Objective: Vitals:   10/09/21 0811 10/09/21 0820 10/09/21 1100 10/09/21 1400  BP:  (!) 129/52 (!) 149/64   Pulse: 84 83  73  Resp: 18     Temp:   97.7 F (36.5 C)   TempSrc:   Oral   SpO2: 92% 96%  97%  Weight:      Height:        Intake/Output Summary (Last 24 hours) at 10/09/2021 1447 Last data filed at 10/09/2021 1256 Gross per 24 hour  Intake 578 ml  Output 0 ml  Net 578 ml   Filed Weights   10/08/21 2331 10/09/21 0400  Weight: 113 kg 112.9 kg    Examination:  General exam: Appears calm and comfortable  Respiratory system: Poor air movement bilaterally, expiratory wheezes Cardiovascular system: S1 & S2 heard, RRR.  Abd: nondistended, soft and nontender.Normal bowel sounds heard. Central nervous system: Alert and oriented. No focal neurological deficits. Extremities: no edema Skin: No rashes Psychiatry: Judgement and insight appear normal. Mood & affect appropriate.     Data Reviewed:   CBC: Recent Labs  Lab 10/07/21 2111 10/09/21 0109  WBC 11.2* 13.5*  NEUTROABS 7.9*  --   HGB 13.0 13.7  HCT 39.6 39.6  MCV 88.8 86.5  PLT 286 345   Basic Metabolic Panel: Recent Labs  Lab 10/07/21 2111 10/09/21 0109  NA 134* 136  K 4.3 3.8  CL 98 98  CO2 27 26  GLUCOSE 205* 179*  BUN 10 11  CREATININE 0.82 0.85  CALCIUM 8.9 9.3   GFR: Estimated Creatinine Clearance: 94.4 mL/min (by C-G formula based on SCr of 0.85 mg/dL). Liver Function Tests: Recent Labs  Lab 10/08/21 0511  AST 17  ALT 19  ALKPHOS 67  BILITOT 0.7  PROT 6.6  ALBUMIN 3.7   No results for input(s): LIPASE, AMYLASE in the last 168 hours. No results for input(s): AMMONIA in the last 168 hours. Coagulation Profile: No results for input(s): INR, PROTIME in the last 168 hours. Cardiac Enzymes: No results for input(s): CKTOTAL, CKMB, CKMBINDEX, TROPONINI in the last 168 hours. BNP (last 3  results) No results for input(s): PROBNP in the last 8760 hours. HbA1C: No results for input(s): HGBA1C in the last 72 hours. CBG: Recent Labs  Lab 10/08/21 0651 10/09/21 0619 10/09/21 1055  GLUCAP 179* 189* 239*   Lipid Profile: No results for input(s): CHOL, HDL, LDLCALC, TRIG, CHOLHDL, LDLDIRECT in the last 72 hours. Thyroid Function Tests: No results for input(s): TSH, T4TOTAL, FREET4, T3FREE, THYROIDAB in the last 72 hours. Anemia Panel: No results for input(s): VITAMINB12, FOLATE, FERRITIN, TIBC, IRON, RETICCTPCT in the last 72 hours. Urine analysis: No results found for: COLORURINE, APPEARANCEUR, LABSPEC, PHURINE, GLUCOSEU, HGBUR, BILIRUBINUR, KETONESUR, PROTEINUR, UROBILINOGEN, NITRITE, LEUKOCYTESUR Sepsis Labs: @LABRCNTIP (procalcitonin:4,lacticidven:4)  ) Recent Results (from the past 240 hour(s))  Resp Panel by RT-PCR (Flu A&B, Covid) Nasopharyngeal Swab     Status: None   Collection Time: 10/08/21  5:01 AM   Specimen: Nasopharyngeal Swab; Nasopharyngeal(NP) swabs in vial transport medium  Result Value Ref Range Status   SARS Coronavirus 2 by RT PCR NEGATIVE NEGATIVE Final    Comment: (NOTE) SARS-CoV-2 target nucleic acids are NOT DETECTED.  The SARS-CoV-2 RNA is generally detectable in upper respiratory specimens during the acute phase of infection. The lowest concentration of SARS-CoV-2 viral copies this assay can detect is 138 copies/mL. A negative result does not preclude SARS-Cov-2 infection and should not be used as the sole basis for treatment or other patient management decisions. A negative result may occur with  improper specimen collection/handling, submission of specimen other than nasopharyngeal swab, presence of viral mutation(s) within the areas targeted by this assay, and inadequate number of viral copies(<138 copies/mL). A negative result must be combined with clinical observations, patient history, and epidemiological information. The expected  result is Negative.  Fact Sheet for Patients:  EntrepreneurPulse.com.au  Fact Sheet for Healthcare Providers:  IncredibleEmployment.be  This test is no t yet approved or cleared by the [place] FDA and  has been authorized for detection and/or diagnosis of SARS-CoV-2 by FDA under an Emergency Use Authorization (EUA). This EUA will remain  in effect (meaning this test can be used) for the duration of the COVID-19 declaration under Section 564(b)(1) of the Act, 21 U.S.C.section 360bbb-3(b)(1), unless the authorization is terminated  or revoked sooner.       Influenza A by PCR NEGATIVE NEGATIVE Final   Influenza B by PCR NEGATIVE NEGATIVE Final    Comment: (NOTE) The Xpert Xpress SARS-CoV-2/FLU/RSV plus assay is intended as an aid in the diagnosis of influenza from Nasopharyngeal swab specimens and should not be used as a sole basis for treatment. Nasal washings and aspirates are unacceptable for Xpert Xpress SARS-CoV-2/FLU/RSV testing.  Fact Sheet for Patients: EntrepreneurPulse.com.au  Fact Sheet for Healthcare Providers: IncredibleEmployment.be  This test is not yet approved or cleared by the [place] FDA and has been authorized for detection and/or diagnosis of SARS-CoV-2 by FDA under an Emergency Use Authorization (EUA). This EUA will remain in effect (meaning this test can be used) for the duration of the COVID-19 declaration under Section 564(b)(1) of the Act, 21 U.S.C. section  360bbb-3(b)(1), unless the authorization is terminated or revoked.  Performed at [facility], [place] [address], [place], [place] [address]          Radiology Studies: DG Chest 2 View  Result Date: 10/07/2021 CLINICAL DATA:  shortness of breath EXAM: CHEST - 2 VIEW COMPARISON:  Chest x-ray 09/14/2021 FINDINGS: The heart and mediastinal contours are unchanged. Slightly prominent hilar vasculature. No focal  consolidation. Slightly increased interstitial markings. No pleural effusion. No pneumothorax. No acute osseous abnormality. IMPRESSION: Possible mild pulmonary edema. Electronically Signed   By: Iven Finn M.D.   On: 10/07/2021 21:38   CT CHEST WO CONTRAST  Result Date: 10/08/2021 CLINICAL DATA:  Respiratory illness, chronic shortness of breath and wheezing. EXAM: CT CHEST WITHOUT CONTRAST TECHNIQUE: Multidetector CT imaging of the chest was performed following the standard protocol without IV contrast. COMPARISON:  10/07/2021 FINDINGS: Cardiovascular: The heart is normal in size and there is no pericardial effusion. Scattered coronary artery calcifications are present. Mild aortic atherosclerosis without evidence of aneurysm. The pulmonary trunk is normal in caliber. Mediastinum/Nodes: No enlarged mediastinal or axillary lymph nodes. Thyroid gland, trachea, and esophagus demonstrate no significant findings. Lungs/Pleura: Scattered ground-glass opacities are present in the lungs bilaterally. No effusion or pneumothorax. Mild bronchiectasis is noted. Upper Abdomen: No acute abnormality. Musculoskeletal: Degenerative changes are present in the thoracic spine. No acute or suspicious osseous abnormality. IMPRESSION: 1. Bronchiectasis with scattered ground-glass opacities in the lungs bilaterally, possible pneumonitis. 2. Aortic atherosclerosis. 3. Coronary artery calcifications. Electronically Signed   By: Brett Fairy M.D.   On: 10/08/2021 22:18   ECHOCARDIOGRAM COMPLETE  Result Date: 10/08/2021    ECHOCARDIOGRAM REPORT   Patient Name:   DEMETRIA MOLLOY Date of Exam: 10/08/2021 Medical Rec #:  [id number]      Height:       71.0 in Accession #:    [id number]     Weight:       274.9 lb Date of Birth:  25-Dec-1945      BSA:          2.414 m Patient Age:    24 years       BP:           178/76 mmHg Patient Gender: M              HR:           95 bpm. Exam Location:  Inpatient Procedure: 2D Echo, Cardiac Doppler and  Color Doppler Indications:    CHF  History:        Patient has no prior history of Echocardiogram examinations.                 COPD; Risk Factors:Hypertension and Diabetes.  Sonographer:    Wenda Low Referring Phys: [id number] RONDELL A SMITH IMPRESSIONS  1. Left ventricular ejection fraction, by estimation, is 65 to 70%. The left ventricle has normal function. The left ventricle has no regional wall motion abnormalities. There is mild concentric left ventricular hypertrophy. Left ventricular diastolic parameters are indeterminate.  2. Right ventricular systolic function is normal. The right ventricular size is mildly enlarged. Tricuspid regurgitation signal is inadequate for assessing PA pressure.  3. The mitral valve is normal in structure. No evidence of mitral valve regurgitation. No evidence of mitral stenosis.  4. The aortic valve has an indeterminant number of cusps. Aortic valve regurgitation is not visualized. No aortic stenosis is present.  5. The inferior vena cava is normal in size with  greater than 50% respiratory variability, suggesting right atrial pressure of 3 mmHg. Comparison(s): No prior Echocardiogram. FINDINGS  Left Ventricle: Left ventricular ejection fraction, by estimation, is 65 to 70%. The left ventricle has normal function. The left ventricle has no regional wall motion abnormalities. The left ventricular internal cavity size was small. There is mild concentric left ventricular hypertrophy. Left ventricular diastolic parameters are indeterminate. Right Ventricle: The right ventricular size is mildly enlarged. No increase in right ventricular wall thickness. Right ventricular systolic function is normal. Tricuspid regurgitation signal is inadequate for assessing PA pressure. Left Atrium: Left atrial size was normal in size. Right Atrium: Right atrial size was normal in size. Pericardium: There is no evidence of pericardial effusion. Presence of epicardial fat layer. Mitral Valve: The  mitral valve is normal in structure. No evidence of mitral valve regurgitation. No evidence of mitral valve stenosis. MV peak gradient, 2.3 mmHg. The mean mitral valve gradient is 1.0 mmHg. Tricuspid Valve: The tricuspid valve is normal in structure. Tricuspid valve regurgitation is not demonstrated. No evidence of tricuspid stenosis. Aortic Valve: The aortic valve has an indeterminant number of cusps. Aortic valve regurgitation is not visualized. No aortic stenosis is present. Aortic valve mean gradient measures 3.0 mmHg. Aortic valve peak gradient measures 6.2 mmHg. Aortic valve area, by VTI measures 2.77 cm. Pulmonic Valve: The pulmonic valve was normal in structure. Pulmonic valve regurgitation is not visualized. No evidence of pulmonic stenosis. Aorta: The aortic root is normal in size and structure. Venous: The inferior vena cava is normal in size with greater than 50% respiratory variability, suggesting right atrial pressure of 3 mmHg. IAS/Shunts: No atrial level shunt detected by color flow Doppler.  LEFT VENTRICLE PLAX 2D LVIDd:         4.00 cm LVIDs:         2.30 cm LV PW:         1.30 cm LV IVS:        1.40 cm LVOT diam:     2.10 cm LV SV:         67 LV SV Index:   28 LVOT Area:     3.46 cm  RIGHT VENTRICLE RV Basal diam:  3.80 cm RV Mid diam:    3.10 cm RV S prime:     13.90 cm/s TAPSE (M-mode): 2.6 cm LEFT ATRIUM             Index        RIGHT ATRIUM           Index LA diam:        3.70 cm 1.53 cm/m   RA Area:     16.50 cm LA Vol (A2C):   50.2 ml 20.80 ml/m  RA Volume:   38.20 ml  15.83 ml/m LA Vol (A4C):   31.8 ml 13.17 ml/m LA Biplane Vol: 41.0 ml 16.99 ml/m  AORTIC VALVE                    PULMONIC VALVE AV Area (Vmax):    3.04 cm     PV Vmax:       1.20 m/s AV Area (Vmean):   2.65 cm     PV Peak grad:  5.8 mmHg AV Area (VTI):     2.77 cm AV Vmax:           124.00 cm/s AV Vmean:          84.400 cm/s AV VTI:  0.241 m AV Peak Grad:      6.2 mmHg AV Mean Grad:      3.0 mmHg LVOT  Vmax:         109.00 cm/s LVOT Vmean:        64.500 cm/s LVOT VTI:          0.193 m LVOT/AV VTI ratio: 0.80  AORTA Ao Root diam: 3.40 cm MITRAL VALVE MV Area (PHT): 4.06 cm    SHUNTS MV Area VTI:   2.64 cm    Systemic VTI:  0.19 m MV Peak grad:  2.3 mmHg    Systemic Diam: 2.10 cm MV Mean grad:  1.0 mmHg MV Vmax:       0.76 m/s MV Vmean:      47.4 cm/s MV Decel Time: 187 msec MV E velocity: 72.40 cm/s MV A velocity: 87.00 cm/s MV E/A ratio:  0.83 Kardie Tobb DO Electronically signed by Berniece Salines DO Signature Date/Time: 10/08/2021/2:18:04 PM    Final         Scheduled Meds:  arformoterol  15 mcg Nebulization BID   aspirin EC  325 mg Oral Daily   budesonide (PULMICORT) nebulizer solution  0.5 mg Nebulization BID   enoxaparin (LOVENOX) injection  40 mg Subcutaneous Q24H   guaiFENesin  600 mg Oral BID   insulin aspart  0-15 Units Subcutaneous TID WC   insulin glargine-yfgn  40 Units Subcutaneous QHS   ipratropium-albuterol  3 mL Nebulization TID   methylPREDNISolone (SOLU-MEDROL) injection  60 mg Intravenous Q24H   metoprolol tartrate  25 mg Oral BID   Oxcarbazepine  450 mg Oral BID   pantoprazole  40 mg Oral Daily   pramipexole  0.5 mg Oral QHS   prazosin  15 mg Oral QHS   pregabalin  300 mg Oral BID   rosuvastatin  10 mg Oral Once per day on Mon Wed Fri   Continuous Infusions:  cefTRIAXone (ROCEPHIN)  IV 1 g (10/09/21 1109)     LOS: 1 day    Time spent: 56min    Domenic Polite, MD [facility]   10/09/2021, 2:47 PM

## 2021-10-09 NOTE — Evaluation (Signed)
Physical Therapy Evaluation Patient Details Name: Vincent Richmond MRN: [id number] DOB: 1946/01/12 Today's Date: 10/09/2021  History of Present Illness  Pt is a 76 y.o. male who presented 10/07/21 with progressively worsening shortness of breath. Pt admitted with CHF/COPD exacerbation. PMH: HTN, COPD, DM2, OSA, obesity   Clinical Impression  Pt presents with condition above and deficits mentioned below, see PT Problem List. PTA, he was mod I with functional mobility, reportedly intermittently ambulating without an AD, with SPC, and with rollator. He reports a hx of falls and tendency to catch himself on the walls often, especially when turning. He lives with his wife in a 1-level house with a ramped entrance. Currently, pt displays deficits in activity tolerance, balance, and gross strength. He is at risk for falls, displaying x2 LOB bouts needing up to minA to recover, each time when turning today. Pt has difficulty explaining what sensation, lightheadedness or spinning, occurs to cause his LOB. Pt may benefit from some Vestibular testing in future sessions. Educated pt to use his rollator and to slow down the turn and keep his head and shoulders aligned to reduce his risk for falls. Pt would benefit from further acute PT services and follow-up with HHPT to maximize his safety and independence with all functional mobility.     Recommendations for follow up therapy are one component of a multi-disciplinary discharge planning process, led by the attending physician.  Recommendations may be updated based on patient status, additional functional criteria and insurance authorization.  Follow Up Recommendations Home health PT    Assistance Recommended at Discharge Intermittent Supervision/Assistance  Patient can return home with the following  A little help with walking and/or transfers;Assistance with cooking/housework;Assist for transportation;Help with stairs or ramp for entrance    Equipment  Recommendations None recommended by PT  Recommendations for Other Services       Functional Status Assessment Patient has had a recent decline in their functional status and demonstrates the ability to make significant improvements in function in a reasonable and predictable amount of time.     Precautions / Restrictions Precautions Precautions: Fall;Other (comment) Precaution Comments: monitor SpO2; gets dizzy with LOB when turning Restrictions Weight Bearing Restrictions: No      Mobility  Bed Mobility Overal bed mobility: Needs Assistance Bed Mobility: Supine to Sit     Supine to sit: Supervision;HOB elevated     General bed mobility comments: Extra time, but pt able to transition supine > sit R EOB with HOB elevated with supervision.    Transfers Overall transfer level: Needs assistance Equipment used: 1 person hand held assist Transfers: Sit to/from Stand Sit to Stand: Min guard           General transfer comment: Min guard and extra time to power up to stand from EOB with wide BOS.    Ambulation/Gait Ambulation/Gait assistance: Min guard;Min assist Gait Distance (Feet): 280 Feet Assistive device: Rolling walker (2 wheels) Gait Pattern/deviations: Step-through pattern;Decreased stride length;Trunk flexed Gait velocity: reduced Gait velocity interpretation: <1.8 ft/sec, indicate of risk for recurrent falls   General Gait Details: Pt with slow, fairly steady gait when going in a straight path. However, pt would have significant lateral LOB each time he would turn, needing minA to recover. Educated pt to use his rollator at home and keep head and shoulders aligned and slowly turn to reduce risk of LOB.  Stairs            Wheelchair Mobility    Modified Rankin (Stroke Patients  Only)       Balance Overall balance assessment: Needs assistance Sitting-balance support: No upper extremity supported;Feet supported Sitting balance-Leahy Scale: Fair      Standing balance support: Reliant on assistive device for balance Standing balance-Leahy Scale: Poor Standing balance comment: Reliant on RW, x2 LOB needing minA to recover.                             Pertinent Vitals/Pain Pain Assessment: Faces Faces Pain Scale: Hurts a little bit Pain Location: discomfort with coughing Pain Descriptors / Indicators: Discomfort Pain Intervention(s): Limited activity within patient's tolerance;Monitored during session;Repositioned    Home Living Family/patient expects to be discharged to:: Private residence Living Arrangements: Spouse/significant other Available Help at Discharge: Family;Available 24 hours/day Type of Home: House Home Access: Ramped entrance       Home Layout: One level Home Equipment: Rollator (4 wheels);Cane - single point;Shower seat;Grab bars - tub/shower;Grab bars - toilet      Prior Function Prior Level of Function : History of Falls (last six months);Independent/Modified Independent             Mobility Comments: Pt reports he intermittently walks without an AD, with a SPC, and with his rollator. Reports a hx of falls, mainly in which he catches himself against the wall. He reports it tends to happen when he turns. ADLs Comments: He reports he can but chooses not to cook or drive.     Hand Dominance        Extremity/Trunk Assessment   Upper Extremity Assessment Upper Extremity Assessment: Defer to OT evaluation    Lower Extremity Assessment Lower Extremity Assessment: Generalized weakness    Cervical / Trunk Assessment Cervical / Trunk Assessment: Normal  Communication   Communication: No difficulties  Cognition Arousal/Alertness: Awake/alert Behavior During Therapy: WFL for tasks assessed/performed Overall Cognitive Status: Within Functional Limits for tasks assessed                                          General Comments General comments (skin integrity, edema,  etc.): SpO2 87-92% on RA throughout, even at rest    Exercises     Assessment/Plan    PT Assessment Patient needs continued PT services  PT Problem List Decreased strength;Decreased activity tolerance;Decreased balance;Decreased mobility;Cardiopulmonary status limiting activity       PT Treatment Interventions DME instruction;Gait training;Functional mobility training;Therapeutic activities;Therapeutic exercise;Balance training;Neuromuscular re-education;Patient/family education    PT Goals (Current goals can be found in the Care Plan section)  Acute Rehab PT Goals Patient Stated Goal: to get better PT Goal Formulation: With patient Time For Goal Achievement: 10/23/21 Potential to Achieve Goals: Good    Frequency Min 3X/week     Co-evaluation               AM-PAC PT "6 Clicks" Mobility  Outcome Measure Help needed turning from your back to your side while in a flat bed without using bedrails?: A Little Help needed moving from lying on your back to sitting on the side of a flat bed without using bedrails?: A Little Help needed moving to and from a bed to a chair (including a wheelchair)?: A Little Help needed standing up from a chair using your arms (e.g., wheelchair or bedside chair)?: A Little Help needed to walk in hospital room?: A Little Help needed climbing  3-5 steps with a railing? : A Little 6 Click Score: 18    End of Session Equipment Utilized During Treatment: Gait belt Activity Tolerance: Patient tolerated treatment well;Patient limited by fatigue Patient left: in chair;with call bell/phone within reach Nurse Communication: Mobility status;Other (comment) (sats) PT Visit Diagnosis: Other abnormalities of gait and mobility (R26.89);Unsteadiness on feet (R26.81);Muscle weakness (generalized) (M62.81);History of falling (Z91.81);Repeated falls (R29.6);Difficulty in walking, not elsewhere classified (R26.2);Dizziness and giddiness (R42)    Time: [id number] PT  Time Calculation (min) (ACUTE ONLY): 28 min   Charges:   PT Evaluation $PT Eval Moderate Complexity: 1 Mod PT Treatments $Therapeutic Activity: 8-22 mins        Raymond GurneyAnessa Pettis, PT, DPT Acute Rehabilitation Services  Pager: [phone] Office: [phone]   Jewel Baizenessa M Pettis 10/09/2021, 9:52 AM

## 2021-10-09 NOTE — Evaluation (Signed)
Occupational Therapy Evaluation Patient Details Name: Vincent Richmond MRN: [id number] DOB: [birth year] Today's Date: 10/09/2021   History of Present Illness Pt is a 76 y.o. male who presented 10/07/21 with progressively worsening shortness of breath. Pt admitted with CHF/COPD exacerbation. PMH: HTN, COPD, DM2, OSA, obesity   Clinical Impression   Pt received in sleep state in chair. Alerted to auditory and tactile stimulus. Pt enjoys talking and joking with OT. He demonstrates need for use of AD to improve stability with gait and balance. He demonstrates ability to perform ADLs with some assist and supervision especially in regards to safety and management of AD. He is limited by activity tolerance and need for O2. Maintained SpO2 greater than 90% during short gait distance. He reports he manages medication at home but will sometimes forget. Will plan to further evaluate pt's ability to manage medication given recent admission in December 2022. Pt left seated in chair with needs in reach.      Recommendations for follow up therapy are one component of a multi-disciplinary discharge planning process, led by the attending physician.  Recommendations may be updated based on patient status, additional functional criteria and insurance authorization.   Follow Up Recommendations  Home health OT    Assistance Recommended at Discharge Intermittent Supervision/Assistance     Functional Status Assessment  Patient has had a recent decline in their functional status and demonstrates the ability to make significant improvements in function in a reasonable and predictable amount of time.  Equipment Recommendations  None recommended by OT       Precautions / Restrictions Precautions Precautions: Fall;Other (comment) Precaution Comments: monitor SpO2; gets dizzy with LOB when turning Restrictions Weight Bearing Restrictions: No      Mobility Bed Mobility        Transfers Overall transfer level:  Needs assistance Equipment used: Rolling walker (2 wheels);1 person hand held assist   Sit to Stand: Min guard                  Balance Overall balance assessment: Needs assistance                 Standardized Balance Assessment Standardized Balance Assessment : Dynamic Gait Index         ADL either performed or assessed with clinical judgement   ADL Overall ADL's : Needs assistance/impaired Eating/Feeding: Independent   Grooming: Supervision/safety   Upper Body Bathing: Supervision/ safety   Lower Body Bathing: Supervison/ safety   Upper Body Dressing : Supervision/safety   Lower Body Dressing: Supervision/safety   Toilet Transfer: Min guard   Toileting- Clothing Manipulation and Hygiene: Min guard         General ADL Comments:  (would benefit from teaching with adaptive tools)     Vision Baseline Vision/History: 1 Wears glasses Ability to See in Adequate Light: 0 Adequate Vision Assessment?: No apparent visual deficits            Pertinent Vitals/Pain Pain Assessment: No/denies pain     Hand Dominance     Extremity/Trunk Assessment Upper Extremity Assessment Upper Extremity Assessment: Overall WFL for tasks assessed   Lower Extremity Assessment Lower Extremity Assessment: Defer to PT evaluation       Communication Communication Communication: No difficulties   Cognition Arousal/Alertness: Awake/alert Behavior During Therapy: WFL for tasks assessed/performed Overall Cognitive Status: No family/caregiver present to determine baseline cognitive functioning (concerns for medication management)  General Comments  2L via nasal cannula, SpO2 <90% throughout            Home Living Family/patient expects to be discharged to:: Private residence Living Arrangements: Spouse/significant other Available Help at Discharge: Family;Available 24 hours/day Type of Home: House Home Access: Ramped entrance      Home Layout: One level     Bathroom Shower/Tub: Producer, television/film/video: Handicapped height     Home Equipment: Rollator (4 wheels);Cane - single point;Shower seat;Grab bars - tub/shower;Grab bars - toilet          Prior Functioning/Environment Prior Level of Function : History of Falls (last six months);Independent/Modified Independent             Mobility Comments: pt reports using AD when "I think I need it," has rollator at home ADLs Comments: reports wife will assist with LB dressing but he is able to do it        OT Problem List: Decreased activity tolerance;Impaired balance (sitting and/or standing);Decreased safety awareness;Decreased knowledge of use of DME or AE      OT Treatment/Interventions: Self-care/ADL training;Energy conservation;DME and/or AE instruction;Balance training;Patient/family education    OT Goals(Current goals can be found in the care plan section) Acute Rehab OT Goals Patient Stated Goal: return home and improve activity tolerance OT Goal Formulation: With patient Time For Goal Achievement: 10/23/21 ADL Goals Pt Will Perform Grooming: with modified independence;standing Pt Will Perform Lower Body Bathing: with set-up;with adaptive equipment;sit to/from stand Pt Will Perform Lower Body Dressing: with adaptive equipment;sit to/from stand;with modified independence  OT Frequency: Min 2X/week       AM-PAC OT "6 Clicks" Daily Activity     Outcome Measure Help from another person eating meals?: None Help from another person taking care of personal grooming?: A Little Help from another person toileting, which includes using toliet, bedpan, or urinal?: A Little Help from another person bathing (including washing, rinsing, drying)?: A Little Help from another person to put on and taking off regular upper body clothing?: A Little Help from another person to put on and taking off regular lower body clothing?: A Little 6 Click Score:  19   End of Session Equipment Utilized During Treatment: Rolling walker (2 wheels)  Activity Tolerance: Patient limited by fatigue Patient left: in chair;with call bell/phone within reach  OT Visit Diagnosis: History of falling (Z91.81);Other abnormalities of gait and mobility (R26.89)                Time: 4481-8563 OT Time Calculation (min): 35 min Charges:  OT General Charges $OT Visit: 1 Visit OT Evaluation $OT Eval Low Complexity: 1 Low OT Treatments $Self Care/Home Management : 8-22 mins    Jasmine Pang Adoni Greenough, OTR/L 10/09/2021, 3:52 PM

## 2021-10-09 NOTE — TOC Progression Note (Signed)
Transition of Care [facility]) - Progression Note    Patient Details  Name: Aquan Kope MRN: [id number] Date of Birth: 09/03/1946  Transition of Care [facility]) CM/SW Contact  Leone Haven, RN Phone Number: 10/09/2021, 10:25 PM  Clinical Narrative:     Transition of Care [facility]) Screening Note   Patient Details  Name: Daronte Shostak Date of Birth: 05/17/1946   Transition of Care [facility]) CM/SW Contact:    Leone Haven, RN Phone Number: 10/09/2021, 10:25 PM    Transition of Care Department [facility]) has reviewed patient and no TOC needs have been identified at this time. We will continue to monitor patient advancement through interdisciplinary progression rounds. If new patient transition needs arise, please place a TOC consult.          Expected Discharge Plan and Services                                                 Social Determinants of Health (SDOH) Interventions    Readmission Risk Interventions No flowsheet data found.

## 2021-10-09 NOTE — Plan of Care (Signed)
°  Problem: Education: °Goal: Ability to demonstrate management of disease process will improve °Outcome: Progressing °Goal: Ability to verbalize understanding of medication therapies will improve °Outcome: Progressing °Goal: Individualized Educational Video(s) °Outcome: Progressing °  °

## 2021-10-10 DIAGNOSIS — R0603 Acute respiratory distress: Secondary | ICD-10-CM | POA: Diagnosis not present

## 2021-10-10 LAB — CBC
HCT: 39.9 % (ref 39.0–52.0)
Hemoglobin: 13.1 g/dL (ref 13.0–17.0)
MCH: 29 pg (ref 26.0–34.0)
MCHC: 32.8 g/dL (ref 30.0–36.0)
MCV: 88.5 fL (ref 80.0–100.0)
Platelets: 306 10*3/uL (ref 150–400)
RBC: 4.51 MIL/uL (ref 4.22–5.81)
RDW: 13.5 % (ref 11.5–15.5)
WBC: 13.5 10*3/uL — ABNORMAL HIGH (ref 4.0–10.5)
nRBC: 0 % (ref 0.0–0.2)

## 2021-10-10 LAB — BASIC METABOLIC PANEL
Anion gap: 7 (ref 5–15)
BUN: 22 mg/dL (ref 8–23)
CO2: 26 mmol/L (ref 22–32)
Calcium: 8.7 mg/dL — ABNORMAL LOW (ref 8.9–10.3)
Chloride: 100 mmol/L (ref 98–111)
Creatinine, Ser: 1.03 mg/dL (ref 0.61–1.24)
GFR, Estimated: >60 mL/min (ref >60)
Glucose, Bld: 173 mg/dL — ABNORMAL HIGH (ref 70–99)
Potassium: 3.8 mmol/L (ref 3.5–5.1)
Sodium: 133 mmol/L — ABNORMAL LOW (ref 135–145)

## 2021-10-10 LAB — GLUCOSE, CAPILLARY
Glucose-Capillary: 169 mg/dL — ABNORMAL HIGH (ref 70–99)
Glucose-Capillary: 130 mg/dL — ABNORMAL HIGH (ref 70–99)
Glucose-Capillary: 191 mg/dL — ABNORMAL HIGH (ref 70–99)
Glucose-Capillary: 179 mg/dL — ABNORMAL HIGH (ref 70–99)

## 2021-10-10 MED ORDER — GUAIFENESIN-DM 100-10 MG/5ML PO SYRP
5.0000 mL | ORAL_SOLUTION | ORAL | Status: DC | PRN
  Administered 2021-10-11 – 2021-10-12 (×3): 5 mL via ORAL
  Filled 2021-10-10 (×5): qty 5

## 2021-10-10 NOTE — Progress Notes (Signed)
PROGRESS NOTE    Vincent Richmond  [id number] DOB: 02-02-1946 DOA: 10/07/2021 PCP: [name]  Brief Narrative: 76/M with history of prior tobacco abuse, type 2 diabetes mellitus, OSA, obesity, hypertension, was recently hospitalized with asthma/COPD exacerbation, at that time treated with steroids duo nebs antibiotics etc. and discharged home on Medrol Dosepak, did well initially, after which he had recurrence of cough wheezing and shortness of breath and hence presented back to the ER. -Complains of productive cough, wheezing, also noticed some leg swelling and PND. -In the ED he was noted to be hypoxic to 91%, placed on 2 L O2 for distress, labs noted BNP of 21, troponin was negative, chest x-ray noted possible pulmonary edema, influenza and COVID PCR were negative   Assessment & Plan:   Acute hypoxic respiratory failure Asthma/COPD overlap syndrome with exacerbation Community-acquired pneumonia -CT chest with bilateral airspace disease, COVID and influenza PCR negative -Continues to be wheezing, continue IV Solu-Medrol, azithromycin and ceftriaxone -Clinically does not appear volume overloaded at this time, will hold off on further Lasix -DuoNebs, wean O2  Acute diastolic CHF -Had trace edema on admission 1/3, resolved now, clinically does not appear fluid overloaded -2D echo notes preserved EF, diastolic parameters could not be assessed, no significant valvular disease  -Diuresed with IV Lasix noted this admission  Type 2 diabetes mellitus -Continue home regimen of Semglee, increased dose to 60 units, also on Ozempic and metformin at baseline -CBGs improving, continue NovoLog with meals  Essential hypertension -Continue metoprolol  PTSD -Continue prazosin  OSA -Continue CPAP nightly    DVT prophylaxis: Lovenox Code Status: Full code Family Communication: Discussed with patient in detail, no family at bedside Disposition Plan:  Status is:  Inpatient  Remains inpatient appropriate because: Severity of illness   Consultants:    Procedures:   Antimicrobials:    Subjective: -Breathing a little better, still continues to be wheezing and congested, productive cough   Objective: Vitals:   10/10/21 0327 10/10/21 0827 10/10/21 0834 10/10/21 1050  BP: 139/60 (!) 127/52  (!) 120/51  Pulse: 76 72  71  Resp: 19     Temp: 98 F (36.7 C)   97.6 F (36.4 C)  TempSrc: Oral   Oral  SpO2: 90% 93% 96% 94%  Weight: 113.3 kg     Height:        Intake/Output Summary (Last 24 hours) at 10/10/2021 1343 Last data filed at 10/10/2021 1300 Gross per 24 hour  Intake 960 ml  Output 0 ml  Net 960 ml   Filed Weights   10/08/21 2331 10/09/21 0400 10/10/21 0327  Weight: 113 kg 112.9 kg 113.3 kg    Examination:  Pleasant male, sitting up in bed, AAOx3  HEENT: No JVD CVs: S1-S2, regular rhythm, tachycardic Lungs: Poor air movement, expiratory wheezes bilaterally Abdomen soft, nontender, bowel sounds present Ext no edema Skin: No rash on exposed skin  Psychiatry: Judgement and insight appear normal. Mood & affect appropriate.     Data Reviewed:   CBC: Recent Labs  Lab 10/07/21 2111 10/09/21 0109 10/10/21 0408  WBC 11.2* 13.5* 13.5*  NEUTROABS 7.9*  --   --   HGB 13.0 13.7 13.1  HCT 39.6 39.6 39.9  MCV 88.8 86.5 88.5  PLT 286 345 [id number]   Basic Metabolic Panel: Recent Labs  Lab 10/07/21 2111 10/09/21 0109 10/10/21 0408  NA 134* 136 133*  K 4.3 3.8 3.8  CL 98 98 100  CO2 27 26 26   GLUCOSE 205*  179* 173*  BUN 10 11 22   CREATININE 0.82 0.85 1.03  CALCIUM 8.9 9.3 8.7*   GFR: Estimated Creatinine Clearance: 78.1 mL/min (by C-G formula based on SCr of 1.03 mg/dL). Liver Function Tests: Recent Labs  Lab 10/08/21 0511  AST 17  ALT 19  ALKPHOS 67  BILITOT 0.7  PROT 6.6  ALBUMIN 3.7   No results for input(s): LIPASE, AMYLASE in the last 168 hours. No results for input(s): AMMONIA in the last 168  hours. Coagulation Profile: No results for input(s): INR, PROTIME in the last 168 hours. Cardiac Enzymes: No results for input(s): CKTOTAL, CKMB, CKMBINDEX, TROPONINI in the last 168 hours. BNP (last 3 results) No results for input(s): PROBNP in the last 8760 hours. HbA1C: No results for input(s): HGBA1C in the last 72 hours. CBG: Recent Labs  Lab 10/09/21 1055 10/09/21 1604 10/09/21 2100 10/10/21 0620 10/10/21 1052  GLUCAP 239* 341* 229* 169* 130*   Lipid Profile: No results for input(s): CHOL, HDL, LDLCALC, TRIG, CHOLHDL, LDLDIRECT in the last 72 hours. Thyroid Function Tests: No results for input(s): TSH, T4TOTAL, FREET4, T3FREE, THYROIDAB in the last 72 hours. Anemia Panel: No results for input(s): VITAMINB12, FOLATE, FERRITIN, TIBC, IRON, RETICCTPCT in the last 72 hours. Urine analysis: No results found for: COLORURINE, APPEARANCEUR, LABSPEC, PHURINE, GLUCOSEU, HGBUR, BILIRUBINUR, KETONESUR, PROTEINUR, UROBILINOGEN, NITRITE, LEUKOCYTESUR Sepsis Labs: @LABRCNTIP (procalcitonin:4,lacticidven:4)  ) Recent Results (from the past 240 hour(s))  Resp Panel by RT-PCR (Flu A&B, Covid) Nasopharyngeal Swab     Status: None   Collection Time: 10/08/21  5:01 AM   Specimen: Nasopharyngeal Swab; Nasopharyngeal(NP) swabs in vial transport medium  Result Value Ref Range Status   SARS Coronavirus 2 by RT PCR NEGATIVE NEGATIVE Final    Comment: (NOTE) SARS-CoV-2 target nucleic acids are NOT DETECTED.  The SARS-CoV-2 RNA is generally detectable in upper respiratory specimens during the acute phase of infection. The lowest concentration of SARS-CoV-2 viral copies this assay can detect is 138 copies/mL. A negative result does not preclude SARS-Cov-2 infection and should not be used as the sole basis for treatment or other patient management decisions. A negative result may occur with  improper specimen collection/handling, submission of specimen other than nasopharyngeal swab, presence  of viral mutation(s) within the areas targeted by this assay, and inadequate number of viral copies(<138 copies/mL). A negative result must be combined with clinical observations, patient history, and epidemiological information. The expected result is Negative.  Fact Sheet for Patients:  BloggerCourse.com  Fact Sheet for Healthcare Providers:  SeriousBroker.it  This test is no t yet approved or cleared by the [place] FDA and  has been authorized for detection and/or diagnosis of SARS-CoV-2 by FDA under an Emergency Use Authorization (EUA). This EUA will remain  in effect (meaning this test can be used) for the duration of the COVID-19 declaration under Section 564(b)(1) of the Act, 21 U.S.C.section 360bbb-3(b)(1), unless the authorization is terminated  or revoked sooner.       Influenza A by PCR NEGATIVE NEGATIVE Final   Influenza B by PCR NEGATIVE NEGATIVE Final    Comment: (NOTE) The Xpert Xpress SARS-CoV-2/FLU/RSV plus assay is intended as an aid in the diagnosis of influenza from Nasopharyngeal swab specimens and should not be used as a sole basis for treatment. Nasal washings and aspirates are unacceptable for Xpert Xpress SARS-CoV-2/FLU/RSV testing.  Fact Sheet for Patients: BloggerCourse.com  Fact Sheet for Healthcare Providers: SeriousBroker.it  This test is not yet approved or cleared by the [place] FDA  and has been authorized for detection and/or diagnosis of SARS-CoV-2 by FDA under an Emergency Use Authorization (EUA). This EUA will remain in effect (meaning this test can be used) for the duration of the COVID-19 declaration under Section 564(b)(1) of the Act, 21 U.S.C. section 360bbb-3(b)(1), unless the authorization is terminated or revoked.  Performed at [facility], [place] [address], [place], [place] [address]          Radiology  Studies: CT CHEST WO CONTRAST  Result Date: 10/08/2021 CLINICAL DATA:  Respiratory illness, chronic shortness of breath and wheezing. EXAM: CT CHEST WITHOUT CONTRAST TECHNIQUE: Multidetector CT imaging of the chest was performed following the standard protocol without IV contrast. COMPARISON:  10/07/2021 FINDINGS: Cardiovascular: The heart is normal in size and there is no pericardial effusion. Scattered coronary artery calcifications are present. Mild aortic atherosclerosis without evidence of aneurysm. The pulmonary trunk is normal in caliber. Mediastinum/Nodes: No enlarged mediastinal or axillary lymph nodes. Thyroid gland, trachea, and esophagus demonstrate no significant findings. Lungs/Pleura: Scattered ground-glass opacities are present in the lungs bilaterally. No effusion or pneumothorax. Mild bronchiectasis is noted. Upper Abdomen: No acute abnormality. Musculoskeletal: Degenerative changes are present in the thoracic spine. No acute or suspicious osseous abnormality. IMPRESSION: 1. Bronchiectasis with scattered ground-glass opacities in the lungs bilaterally, possible pneumonitis. 2. Aortic atherosclerosis. 3. Coronary artery calcifications. Electronically Signed   By: Brett Fairy M.D.   On: 10/08/2021 22:18   ECHOCARDIOGRAM COMPLETE  Result Date: 10/08/2021    ECHOCARDIOGRAM REPORT   Patient Name:   Vincent Richmond Date of Exam: 10/08/2021 Medical Rec #:  [id number]      Height:       71.0 in Accession #:    [id number]     Weight:       274.9 lb Date of Birth:  04-11-1946      BSA:          2.414 m Patient Age:    60 years       BP:           178/76 mmHg Patient Gender: M              HR:           95 bpm. Exam Location:  Inpatient Procedure: 2D Echo, Cardiac Doppler and Color Doppler Indications:    CHF  History:        Patient has no prior history of Echocardiogram examinations.                 COPD; Risk Factors:Hypertension and Diabetes.  Sonographer:    Wenda Low Referring Phys: [id number]  RONDELL A SMITH IMPRESSIONS  1. Left ventricular ejection fraction, by estimation, is 65 to 70%. The left ventricle has normal function. The left ventricle has no regional wall motion abnormalities. There is mild concentric left ventricular hypertrophy. Left ventricular diastolic parameters are indeterminate.  2. Right ventricular systolic function is normal. The right ventricular size is mildly enlarged. Tricuspid regurgitation signal is inadequate for assessing PA pressure.  3. The mitral valve is normal in structure. No evidence of mitral valve regurgitation. No evidence of mitral stenosis.  4. The aortic valve has an indeterminant number of cusps. Aortic valve regurgitation is not visualized. No aortic stenosis is present.  5. The inferior vena cava is normal in size with greater than 50% respiratory variability, suggesting right atrial pressure of 3 mmHg. Comparison(s): No prior Echocardiogram. FINDINGS  Left Ventricle: Left ventricular ejection fraction, by estimation, is  65 to 70%. The left ventricle has normal function. The left ventricle has no regional wall motion abnormalities. The left ventricular internal cavity size was small. There is mild concentric left ventricular hypertrophy. Left ventricular diastolic parameters are indeterminate. Right Ventricle: The right ventricular size is mildly enlarged. No increase in right ventricular wall thickness. Right ventricular systolic function is normal. Tricuspid regurgitation signal is inadequate for assessing PA pressure. Left Atrium: Left atrial size was normal in size. Right Atrium: Right atrial size was normal in size. Pericardium: There is no evidence of pericardial effusion. Presence of epicardial fat layer. Mitral Valve: The mitral valve is normal in structure. No evidence of mitral valve regurgitation. No evidence of mitral valve stenosis. MV peak gradient, 2.3 mmHg. The mean mitral valve gradient is 1.0 mmHg. Tricuspid Valve: The tricuspid valve is  normal in structure. Tricuspid valve regurgitation is not demonstrated. No evidence of tricuspid stenosis. Aortic Valve: The aortic valve has an indeterminant number of cusps. Aortic valve regurgitation is not visualized. No aortic stenosis is present. Aortic valve mean gradient measures 3.0 mmHg. Aortic valve peak gradient measures 6.2 mmHg. Aortic valve area, by VTI measures 2.77 cm. Pulmonic Valve: The pulmonic valve was normal in structure. Pulmonic valve regurgitation is not visualized. No evidence of pulmonic stenosis. Aorta: The aortic root is normal in size and structure. Venous: The inferior vena cava is normal in size with greater than 50% respiratory variability, suggesting right atrial pressure of 3 mmHg. IAS/Shunts: No atrial level shunt detected by color flow Doppler.  LEFT VENTRICLE PLAX 2D LVIDd:         4.00 cm LVIDs:         2.30 cm LV PW:         1.30 cm LV IVS:        1.40 cm LVOT diam:     2.10 cm LV SV:         67 LV SV Index:   28 LVOT Area:     3.46 cm  RIGHT VENTRICLE RV Basal diam:  3.80 cm RV Mid diam:    3.10 cm RV S prime:     13.90 cm/s TAPSE (M-mode): 2.6 cm LEFT ATRIUM             Index        RIGHT ATRIUM           Index LA diam:        3.70 cm 1.53 cm/m   RA Area:     16.50 cm LA Vol (A2C):   50.2 ml 20.80 ml/m  RA Volume:   38.20 ml  15.83 ml/m LA Vol (A4C):   31.8 ml 13.17 ml/m LA Biplane Vol: 41.0 ml 16.99 ml/m  AORTIC VALVE                    PULMONIC VALVE AV Area (Vmax):    3.04 cm     PV Vmax:       1.20 m/s AV Area (Vmean):   2.65 cm     PV Peak grad:  5.8 mmHg AV Area (VTI):     2.77 cm AV Vmax:           124.00 cm/s AV Vmean:          84.400 cm/s AV VTI:            0.241 m AV Peak Grad:      6.2 mmHg AV Mean Grad:  3.0 mmHg LVOT Vmax:         109.00 cm/s LVOT Vmean:        64.500 cm/s LVOT VTI:          0.193 m LVOT/AV VTI ratio: 0.80  AORTA Ao Root diam: 3.40 cm MITRAL VALVE MV Area (PHT): 4.06 cm    SHUNTS MV Area VTI:   2.64 cm    Systemic VTI:  0.19 m  MV Peak grad:  2.3 mmHg    Systemic Diam: 2.10 cm MV Mean grad:  1.0 mmHg MV Vmax:       0.76 m/s MV Vmean:      47.4 cm/s MV Decel Time: 187 msec MV E velocity: 72.40 cm/s MV A velocity: 87.00 cm/s MV E/A ratio:  0.83 Kardie Tobb DO Electronically signed by Berniece Salines DO Signature Date/Time: 10/08/2021/2:18:04 PM    Final         Scheduled Meds:  arformoterol  15 mcg Nebulization BID   aspirin EC  325 mg Oral Daily   azithromycin  500 mg Oral Daily   budesonide (PULMICORT) nebulizer solution  0.5 mg Nebulization BID   enoxaparin (LOVENOX) injection  40 mg Subcutaneous Q24H   guaiFENesin  600 mg Oral BID   insulin aspart  0-15 Units Subcutaneous TID WC   insulin aspart  3 Units Subcutaneous TID WC   insulin glargine-yfgn  60 Units Subcutaneous QHS   ipratropium-albuterol  3 mL Nebulization TID   methylPREDNISolone (SOLU-MEDROL) injection  120 mg Intravenous Q24H   metoprolol tartrate  25 mg Oral BID   Oxcarbazepine  450 mg Oral BID   pantoprazole  40 mg Oral Daily   pramipexole  0.5 mg Oral QHS   prazosin  15 mg Oral QHS   pregabalin  300 mg Oral BID   rosuvastatin  10 mg Oral Once per day on Mon Wed Fri   Continuous Infusions:  cefTRIAXone (ROCEPHIN)  IV 1 g (10/10/21 1107)     LOS: 2 days    Time spent: 31min    Domenic Polite, MD [facility]   10/10/2021, 1:43 PM

## 2021-10-10 NOTE — Plan of Care (Signed)
?  Problem: Education: ?Goal: Ability to demonstrate management of disease process will improve ?Outcome: Progressing ?  ?Problem: Activity: ?Goal: Capacity to carry out activities will improve ?Outcome: Progressing ?  ?Problem: Cardiac: ?Goal: Ability to achieve and maintain adequate cardiopulmonary perfusion will improve ?Outcome: Progressing ?  ?

## 2021-10-10 NOTE — Progress Notes (Signed)
According to patient he wears CPAP at hone. Offered cpap for tonight but pt refused. Educated about it. If ever pt change his mInd, we'll get an ordered for CPAP.

## 2021-10-10 NOTE — Progress Notes (Signed)
Mobility Specialist: Progress Note   10/10/21 1244  Mobility  Activity Ambulated in hall  Level of Assistance Minimal assist, patient does 75% or more  Assistive Device Four wheel walker  Distance Ambulated (ft) 450 ft  Mobility Ambulated with assistance in hallway  Mobility Response Tolerated well  Mobility performed by Mobility specialist  Bed Position Chair  $Mobility charge 1 Mobility   Pre-Mobility: 68 HR, 93% SpO2 Post-Mobility: 74 HR, 138/56 BP, 94% SpO2  Pt required minA to sit stand and contact guard throughout. Pt ambulated on 2 L/min [place] with no c/o throughout. Pt to recliner after walk with call bell at his side and family present in the room.   [facility] Tiffanee Mcnee Mobility Specialist Mobility Specialist 4 [place]: [phone] Mobility Specialist 2 [place] and [address] [place]: [phone]

## 2021-10-10 NOTE — Progress Notes (Signed)
Patient educated on output collection. RN has provided the patient with urinal while in the chair when RN rounded at 1300 and 1700. Patient verbalize understanding on urine collection and output instead of using the restroom.

## 2021-10-11 DIAGNOSIS — R0603 Acute respiratory distress: Secondary | ICD-10-CM | POA: Diagnosis not present

## 2021-10-11 LAB — GLUCOSE, CAPILLARY
Glucose-Capillary: 309 mg/dL — ABNORMAL HIGH (ref 70–99)
Glucose-Capillary: 154 mg/dL — ABNORMAL HIGH (ref 70–99)
Glucose-Capillary: 251 mg/dL — ABNORMAL HIGH (ref 70–99)
Glucose-Capillary: 204 mg/dL — ABNORMAL HIGH (ref 70–99)

## 2021-10-11 MED ORDER — CEFDINIR 300 MG PO CAPS
300.0000 mg | ORAL_CAPSULE | Freq: Two times a day (BID) | ORAL | Status: DC
  Administered 2021-10-11 – 2021-10-13 (×5): 300 mg via ORAL
  Filled 2021-10-11 (×5): qty 1

## 2021-10-11 MED ORDER — PREDNISONE 20 MG PO TABS
40.0000 mg | ORAL_TABLET | Freq: Every day | ORAL | Status: DC
  Administered 2021-10-12 – 2021-10-13 (×2): 40 mg via ORAL
  Filled 2021-10-11 (×2): qty 2

## 2021-10-11 MED ORDER — INSULIN ASPART 100 UNIT/ML IJ SOLN
5.0000 [IU] | Freq: Three times a day (TID) | INTRAMUSCULAR | Status: DC
  Administered 2021-10-11 – 2021-10-12 (×3): 5 [IU] via SUBCUTANEOUS

## 2021-10-11 MED ORDER — FUROSEMIDE 10 MG/ML IJ SOLN
20.0000 mg | Freq: Once | INTRAMUSCULAR | Status: AC
  Administered 2021-10-11: 20 mg via INTRAVENOUS
  Filled 2021-10-11: qty 2

## 2021-10-11 MED ORDER — METHYLPREDNISOLONE SODIUM SUCC 40 MG IJ SOLR
40.0000 mg | INTRAMUSCULAR | Status: AC
  Administered 2021-10-11: 40 mg via INTRAVENOUS
  Filled 2021-10-11: qty 1

## 2021-10-11 MED ORDER — POTASSIUM CHLORIDE CRYS ER 20 MEQ PO TBCR
40.0000 meq | EXTENDED_RELEASE_TABLET | Freq: Once | ORAL | Status: AC
  Administered 2021-10-11: 40 meq via ORAL
  Filled 2021-10-11: qty 2

## 2021-10-11 NOTE — Progress Notes (Signed)
Pt does not wear CPAP.

## 2021-10-11 NOTE — Progress Notes (Signed)
Physical Therapy Treatment Patient Details Name: Vincent Richmond MRN: [id number] DOB: [birth year] Today's Date: 10/11/2021   History of Present Illness Pt is a 76 y.o. male who presented 10/07/21 with progressively worsening shortness of breath. Pt admitted with CHF/COPD exacerbation. PMH: HTN, COPD, DM2, OSA, obesity    PT Comments    Pt with no report of dizziness or feeling of "collapsing" today. Pt ambulated and performed numerous turns both to L and R with rollator and didn't experience LOB. Pt with no noted nystagmus with horizontal roll test to L/R or dix hallpike L/R or report of dizziness. Discussed with pt and wife that since he had a "vertigo episode" prior in his life and went under vestibular therapy he is more susceptible to vertigo. These episodes can happen from illness and it may be beneficial to go to outpt vestibular therapy to address the acute episode. Pt ambulated with rollator, SPO2 >90% on RA. Pt continues with wheezing and coughing spell t/o mobility. Acute PT to cont to follow.     Recommendations for follow up therapy are one component of a multi-disciplinary discharge planning process, led by the attending physician.  Recommendations may be updated based on patient status, additional functional criteria and insurance authorization.  Follow Up Recommendations  Home health PT     Assistance Recommended at Discharge Intermittent Supervision/Assistance  Patient can return home with the following A little help with walking and/or transfers;Assistance with cooking/housework;Assist for transportation;Help with stairs or ramp for entrance   Equipment Recommendations  None recommended by PT    Recommendations for Other Services       Precautions / Restrictions Precautions Precautions: Fall;Other (comment) Precaution Comments: monitor SpO2; gets dizzy with LOB when turning Restrictions Weight Bearing Restrictions: No     Mobility  Bed Mobility Overal bed mobility:  Needs Assistance Bed Mobility: Supine to Sit;Sit to Supine     Supine to sit: HOB elevated;Modified independent (Device/Increase time) Sit to supine: Modified independent (Device/Increase time)   General bed mobility comments: HOB elevated, no physical assist, some wheezing during effort    Transfers Overall transfer level: Needs assistance Equipment used: Rollator (4 wheels) Transfers: Sit to/from Stand Sit to Stand: Min guard           General transfer comment: verbal cues for hand placement/push up from bed not pull up on walker    Ambulation/Gait Ambulation/Gait assistance: Min guard Gait Distance (Feet): 250 Feet Assistive device: Rollator (4 wheels) Gait Pattern/deviations: Step-through pattern;Decreased stride length;Trunk flexed Gait velocity: reduced Gait velocity interpretation: <1.31 ft/sec, indicative of household ambulator   General Gait Details: verbal cues to stay in walker and not push too far in front, stand up straight, noted wheezing, initially SPo2 at 88-89% s/p 50' pt's SPO2 remained >90% (90-92). Pt completed numerous turns today both to L and R and pt denied dizziness and didn't experience LOB   Stairs             Wheelchair Mobility    Modified Rankin (Stroke Patients Only)       Balance Overall balance assessment: Needs assistance Sitting-balance support: No upper extremity supported;Feet supported Sitting balance-Leahy Scale: Good     Standing balance support: Bilateral upper extremity supported Standing balance-Leahy Scale: Fair Standing balance comment: improved balance, but cautious with movement                            Cognition Arousal/Alertness: Awake/alert Behavior During Therapy: WFL for tasks  assessed/performed Overall Cognitive Status: No family/caregiver present to determine baseline cognitive functioning                                 General Comments: slow to respond stating "forgive  for being slow"        Exercises      General Comments General comments (skin integrity, edema, etc.): VSS      Pertinent Vitals/Pain Pain Assessment: No/denies pain    Home Living                          Prior Function            PT Goals (current goals can now be found in the care plan section) Acute Rehab PT Goals Patient Stated Goal: to get better PT Goal Formulation: With patient Time For Goal Achievement: 10/23/21 Potential to Achieve Goals: Good Progress towards PT goals: Progressing toward goals    Frequency    Min 3X/week      PT Plan Current plan remains appropriate    Co-evaluation              AM-PAC PT "6 Clicks" Mobility   Outcome Measure  Help needed turning from your back to your side while in a flat bed without using bedrails?: A Little Help needed moving from lying on your back to sitting on the side of a flat bed without using bedrails?: A Little Help needed moving to and from a bed to a chair (including a wheelchair)?: A Little Help needed standing up from a chair using your arms (e.g., wheelchair or bedside chair)?: A Little Help needed to walk in hospital room?: A Little Help needed climbing 3-5 steps with a railing? : A Little 6 Click Score: 18    End of Session Equipment Utilized During Treatment: Gait belt Activity Tolerance: Patient tolerated treatment well;Patient limited by fatigue Patient left: in chair;with call bell/phone within reach;with family/visitor present Nurse Communication: Mobility status;Other (comment) (sats) PT Visit Diagnosis: Other abnormalities of gait and mobility (R26.89);Unsteadiness on feet (R26.81);Muscle weakness (generalized) (M62.81);History of falling (Z91.81);Repeated falls (R29.6);Difficulty in walking, not elsewhere classified (R26.2);Dizziness and giddiness (R42)     Time: 4166-0630 PT Time Calculation (min) (ACUTE ONLY): 37 min  Charges:  $Gait Training: 8-22 mins $[place]  Rep Proc: 8-22 mins                     Vincent Richmond, PT, DPT Acute Rehabilitation Services Pager #: [phone] Office #: [phone]    Vincent Richmond 10/11/2021, 1:30 PM

## 2021-10-11 NOTE — Plan of Care (Signed)
  Problem: Education: Goal: Ability to verbalize understanding of medication therapies will improve Outcome: Progressing Goal: Individualized Educational Video(s) Outcome: Progressing   

## 2021-10-11 NOTE — Progress Notes (Signed)
Pt does not wear CPAP. Order [facility]

## 2021-10-11 NOTE — Progress Notes (Signed)
Occupational Therapy Treatment Patient Details Name: Vincent Richmond MRN: [id number] DOB: 02/08/1946 Today's Date: 10/11/2021   History of present illness Pt is a 76 y.o. male who presented 10/07/21 with progressively worsening shortness of breath. Pt admitted with CHF/COPD exacerbation. PMH: HTN, COPD, DM2, OSA, obesity   OT comments  Patient continues to make steady progress towards goals in skilled OT session. Patient's session encompassed functional mobility in order to improve activity tolerance. Patient now on room air, and able to maintain saturations above 91% while walking the entire unit. Patient also with improved pursed lip breathing during session. Discharge remains appropriate, therapy will follow in house.     Recommendations for follow up therapy are one component of a multi-disciplinary discharge planning process, led by the attending physician.  Recommendations may be updated based on patient status, additional functional criteria and insurance authorization.    Follow Up Recommendations  Home health OT    Assistance Recommended at Discharge Intermittent Supervision/Assistance  Patient can return home with the following      Equipment Recommendations  None recommended by OT    Recommendations for Other Services      Precautions / Restrictions Precautions Precautions: Fall;Other (comment) Precaution Comments: monitor SpO2; gets dizzy with LOB when turning Restrictions Weight Bearing Restrictions: No       Mobility Bed Mobility Overal bed mobility: Needs Assistance Bed Mobility: Supine to Sit     Supine to sit: Supervision;HOB elevated     General bed mobility comments: patient with improved pacing and cadence    Transfers Overall transfer level: Needs assistance Equipment used: Rolling walker (2 wheels) Transfers: Sit to/from Stand Sit to Stand: Min guard                 Balance Overall balance assessment: Needs assistance Sitting-balance  support: No upper extremity supported;Feet supported Sitting balance-Leahy Scale: Good     Standing balance support: Bilateral upper extremity supported Standing balance-Leahy Scale: Fair Standing balance comment: improved balance, but cautious with movement                           ADL either performed or assessed with clinical judgement   ADL Overall ADL's : Needs assistance/impaired                         Toilet Transfer: Min guard           Functional mobility during ADLs: Min guard General ADL Comments: highly motivated to return to prior level of function    Extremity/Trunk Assessment              Vision       Perception     Praxis      Cognition Arousal/Alertness: Awake/alert Behavior During Therapy: WFL for tasks assessed/performed Overall Cognitive Status: No family/caregiver present to determine baseline cognitive functioning                                            Exercises     Shoulder Instructions       General Comments      Pertinent Vitals/ Pain       Pain Assessment: No/denies pain  Home Living  Prior Functioning/Environment              Frequency  Min 2X/week        Progress Toward Goals  OT Goals(current goals can now be found in the care plan section)  Progress towards OT goals: Progressing toward goals  Acute Rehab OT Goals Patient Stated Goal: return home and improve activity tolerance OT Goal Formulation: With patient Time For Goal Achievement: 10/23/21  Plan Discharge plan remains appropriate    Co-evaluation                 AM-PAC OT "6 Clicks" Daily Activity     Outcome Measure   Help from another person eating meals?: None Help from another person taking care of personal grooming?: A Little Help from another person toileting, which includes using toliet, bedpan, or urinal?: A Little Help from  another person bathing (including washing, rinsing, drying)?: A Little Help from another person to put on and taking off regular upper body clothing?: None Help from another person to put on and taking off regular lower body clothing?: A Little 6 Click Score: 20    End of Session Equipment Utilized During Treatment: Rolling walker (2 wheels)  OT Visit Diagnosis: History of falling (Z91.81);Other abnormalities of gait and mobility (R26.89)   Activity Tolerance Patient tolerated treatment well   Patient Left in chair;with call bell/phone within reach   Nurse Communication Mobility status        Time: [id number] OT Time Calculation (min): 25 min  Charges: OT General Charges $OT Visit: 1 Visit OT Treatments $Self Care/Home Management : 23-37 mins  Corinne Ports E. Paitynn Mikus, COTA/L Acute Rehabilitation Services [place] 10/11/2021, 12:15 PM

## 2021-10-11 NOTE — Progress Notes (Signed)
PROGRESS NOTE    Vincent Richmond  [id number] DOB: May 16, 1946 DOA: 10/07/2021 PCP: [facility]  Brief Narrative: 75/M with history of prior tobacco abuse, type 2 diabetes mellitus, OSA, obesity, hypertension, was recently hospitalized with asthma/COPD exacerbation, at that time treated with steroids duo nebs antibiotics etc. and discharged home on Medrol Dosepak, did well initially, after which he had recurrence of cough wheezing and shortness of breath and hence presented back to the ER. -Complains of productive cough, wheezing, also noticed some leg swelling and PND. -In the ED he was noted to be hypoxic to 91%, placed on 2 L O2 for distress, labs noted BNP of 21, troponin was negative, chest x-ray noted possible pulmonary edema, influenza and COVID PCR were negative   Assessment & Plan:   Acute hypoxic respiratory failure Asthma/COPD overlap syndrome with exacerbation Community-acquired pneumonia -CT chest with bilateral airspace disease, COVID and influenza PCR negative -Clinically improving, less wheezing today  -Will transition to oral prednisone tomorrow, p.o. antibiotics  -DuoNebs, wean O2 -Discharge planning  Acute diastolic CHF -Had trace edema on admission 1/3, resolved now, -2D echo notes preserved EF, diastolic parameters could not be assessed, no significant valvular disease  -Diuresed with IV Lasix earlier this admission, off diuretics since, lasix 20mg  x1 now  Type 2 diabetes mellitus -Continue home regimen of Semglee, increased dose to 60 units, also on Ozempic and metformin at baseline,  CBGs remain elevated, will increase meal coverage insulin   Essential hypertension -Continue metoprolol  PTSD -Continue prazosin  OSA -Continue CPAP nightly  DVT prophylaxis: Lovenox Code Status: Full code Family Communication: Discussed with patient in detail, no family at bedside Disposition Plan:  Status is: Inpatient  Remains inpatient appropriate  because: Severity of illness   Consultants:    Procedures:   Antimicrobials:    Subjective: -Feels better, breathing is improving  Objective: Vitals:   10/11/21 0718 10/11/21 0801 10/11/21 0901 10/11/21 1207  BP: (!) 132/56 (!) 134/52    Pulse: 73 76    Resp: 18     Temp: 97.9 F (36.6 C)     TempSrc: Oral     SpO2: (!) 89% 91% 94% 91%  Weight:      Height:        Intake/Output Summary (Last 24 hours) at 10/11/2021 1227 Last data filed at 10/11/2021 1217 Gross per 24 hour  Intake 600 ml  Output 1000 ml  Net -400 ml   Filed Weights   10/09/21 0400 10/10/21 0327 10/11/21 0450  Weight: 112.9 kg 113.3 kg 112.8 kg    Examination:  Pleasant male sitting up in bed, AAOx3, no distress HEENT: No JVD CVS: S1-S2, regular rhythm, tach Lungs: Improved air movement, no expiratory wheezes today Abdomen: Soft, nontender, bowel sounds present Extremities: No edema Skin: No rash on exposed skin  Data Reviewed:   CBC: Recent Labs  Lab 10/07/21 2111 10/09/21 0109 10/10/21 0408  WBC 11.2* 13.5* 13.5*  NEUTROABS 7.9*  --   --   HGB 13.0 13.7 13.1  HCT 39.6 39.6 39.9  MCV 88.8 86.5 88.5  PLT 286 345 306   Basic Metabolic Panel: Recent Labs  Lab 10/07/21 2111 10/09/21 0109 10/10/21 0408  NA 134* 136 133*  K 4.3 3.8 3.8  CL 98 98 100  CO2 27 26 26   GLUCOSE 205* 179* 173*  BUN 10 11 22   CREATININE 0.82 0.85 1.03  CALCIUM 8.9 9.3 8.7*   GFR: Estimated Creatinine Clearance: 77.9 mL/min (by C-G formula based  on SCr of 1.03 mg/dL). Liver Function Tests: Recent Labs  Lab 10/08/21 0511  AST 17  ALT 19  ALKPHOS 67  BILITOT 0.7  PROT 6.6  ALBUMIN 3.7   No results for input(s): LIPASE, AMYLASE in the last 168 hours. No results for input(s): AMMONIA in the last 168 hours. Coagulation Profile: No results for input(s): INR, PROTIME in the last 168 hours. Cardiac Enzymes: No results for input(s): CKTOTAL, CKMB, CKMBINDEX, TROPONINI in the last 168 hours. BNP  (last 3 results) No results for input(s): PROBNP in the last 8760 hours. HbA1C: No results for input(s): HGBA1C in the last 72 hours. CBG: Recent Labs  Lab 10/10/21 1052 10/10/21 1615 10/10/21 2110 10/11/21 0510 10/11/21 1205  GLUCAP 130* 191* 179* 309* 154*   Lipid Profile: No results for input(s): CHOL, HDL, LDLCALC, TRIG, CHOLHDL, LDLDIRECT in the last 72 hours. Thyroid Function Tests: No results for input(s): TSH, T4TOTAL, FREET4, T3FREE, THYROIDAB in the last 72 hours. Anemia Panel: No results for input(s): VITAMINB12, FOLATE, FERRITIN, TIBC, IRON, RETICCTPCT in the last 72 hours. Urine analysis: No results found for: COLORURINE, APPEARANCEUR, LABSPEC, PHURINE, GLUCOSEU, HGBUR, BILIRUBINUR, KETONESUR, PROTEINUR, UROBILINOGEN, NITRITE, LEUKOCYTESUR Sepsis Labs: @LABRCNTIP (procalcitonin:4,lacticidven:4)  ) Recent Results (from the past 240 hour(s))  Resp Panel by RT-PCR (Flu A&B, Covid) Nasopharyngeal Swab     Status: None   Collection Time: 10/08/21  5:01 AM   Specimen: Nasopharyngeal Swab; Nasopharyngeal(NP) swabs in vial transport medium  Result Value Ref Range Status   SARS Coronavirus 2 by RT PCR NEGATIVE NEGATIVE Final    Comment: (NOTE) SARS-CoV-2 target nucleic acids are NOT DETECTED.  The SARS-CoV-2 RNA is generally detectable in upper respiratory specimens during the acute phase of infection. The lowest concentration of SARS-CoV-2 viral copies this assay can detect is 138 copies/mL. A negative result does not preclude SARS-Cov-2 infection and should not be used as the sole basis for treatment or other patient management decisions. A negative result may occur with  improper specimen collection/handling, submission of specimen other than nasopharyngeal swab, presence of viral mutation(s) within the areas targeted by this assay, and inadequate number of viral copies(<138 copies/mL). A negative result must be combined with clinical observations, patient history,  and epidemiological information. The expected result is Negative.  Fact Sheet for Patients:  BloggerCourse.comhttps://www.fda.gov/media/152166/download  Fact Sheet for Healthcare Providers:  SeriousBroker.ithttps://www.fda.gov/media/152162/download  This test is no t yet approved or cleared by the [place] FDA and  has been authorized for detection and/or diagnosis of SARS-CoV-2 by FDA under an Emergency Use Authorization (EUA). This EUA will remain  in effect (meaning this test can be used) for the duration of the COVID-19 declaration under Section 564(b)(1) of the Act, 21 U.S.C.section 360bbb-3(b)(1), unless the authorization is terminated  or revoked sooner.       Influenza A by PCR NEGATIVE NEGATIVE Final   Influenza B by PCR NEGATIVE NEGATIVE Final    Comment: (NOTE) The Xpert Xpress SARS-CoV-2/FLU/RSV plus assay is intended as an aid in the diagnosis of influenza from Nasopharyngeal swab specimens and should not be used as a sole basis for treatment. Nasal washings and aspirates are unacceptable for Xpert Xpress SARS-CoV-2/FLU/RSV testing.  Fact Sheet for Patients: BloggerCourse.comhttps://www.fda.gov/media/152166/download  Fact Sheet for Healthcare Providers: SeriousBroker.ithttps://www.fda.gov/media/152162/download  This test is not yet approved or cleared by the [place] FDA and has been authorized for detection and/or diagnosis of SARS-CoV-2 by FDA under an Emergency Use Authorization (EUA). This EUA will remain in effect (meaning this test can be  used) for the duration of the COVID-19 declaration under Section 564(b)(1) of the Act, 21 U.S.C. section 360bbb-3(b)(1), unless the authorization is terminated or revoked.  Performed at [facility], [address], [place], [place] [address]      Scheduled Meds:  arformoterol  15 mcg Nebulization BID   aspirin EC  325 mg Oral Daily   budesonide (PULMICORT) nebulizer solution  0.5 mg Nebulization BID   cefdinir  300 mg Oral Q12H   enoxaparin (LOVENOX) injection   40 mg Subcutaneous Q24H   guaiFENesin  600 mg Oral BID   insulin aspart  0-15 Units Subcutaneous TID WC   insulin aspart  5 Units Subcutaneous TID WC   insulin glargine-yfgn  60 Units Subcutaneous QHS   ipratropium-albuterol  3 mL Nebulization TID   methylPREDNISolone (SOLU-MEDROL) injection  40 mg Intravenous Q24H   metoprolol tartrate  25 mg Oral BID   Oxcarbazepine  450 mg Oral BID   pantoprazole  40 mg Oral Daily   pramipexole  0.5 mg Oral QHS   prazosin  15 mg Oral QHS   [START ON 10/12/2021] predniSONE  40 mg Oral Q breakfast   pregabalin  300 mg Oral BID   rosuvastatin  10 mg Oral Once per day on Mon Wed Fri   Continuous Infusions:   LOS: 3 days    Time spent:  Zannie Cove, MD [facility]   10/11/2021, 12:27 PM

## 2021-10-11 NOTE — Progress Notes (Signed)
Mobility Specialist Progress Note:   10/11/21 1143  Mobility  Activity Ambulated in hall  Level of Assistance Standby assist, set-up cues, supervision of patient - no hands on  Assistive Device Four wheel walker  Distance Ambulated (ft) 520 ft  Mobility Ambulated with assistance in hallway  Mobility Response Tolerated well  Mobility performed by Mobility specialist  Bed Position Chair  $Mobility charge 1 Mobility   Pt received in chair willing to participate in mobility. No complaints of pain. Pt returned to chair with call bell in reach and all needs met.   [facility] Public librarian Phone [phone] Secondary Phone [phone]

## 2021-10-12 LAB — BASIC METABOLIC PANEL
Anion gap: 8 (ref 5–15)
BUN: 20 mg/dL (ref 8–23)
CO2: 25 mmol/L (ref 22–32)
Calcium: 8.6 mg/dL — ABNORMAL LOW (ref 8.9–10.3)
Chloride: 102 mmol/L (ref 98–111)
Creatinine, Ser: 0.91 mg/dL (ref 0.61–1.24)
GFR, Estimated: >60 mL/min (ref >60)
Glucose, Bld: 297 mg/dL — ABNORMAL HIGH (ref 70–99)
Potassium: 4.7 mmol/L (ref 3.5–5.1)
Sodium: 135 mmol/L (ref 135–145)

## 2021-10-12 LAB — GLUCOSE, CAPILLARY
Glucose-Capillary: 289 mg/dL — ABNORMAL HIGH (ref 70–99)
Glucose-Capillary: 294 mg/dL — ABNORMAL HIGH (ref 70–99)
Glucose-Capillary: 260 mg/dL — ABNORMAL HIGH (ref 70–99)
Glucose-Capillary: 134 mg/dL — ABNORMAL HIGH (ref 70–99)

## 2021-10-12 MED ORDER — INSULIN ASPART 100 UNIT/ML IJ SOLN
6.0000 [IU] | Freq: Three times a day (TID) | INTRAMUSCULAR | Status: DC
  Administered 2021-10-12 – 2021-10-13 (×4): 6 [IU] via SUBCUTANEOUS

## 2021-10-12 MED ORDER — IPRATROPIUM-ALBUTEROL 0.5-2.5 (3) MG/3ML IN SOLN
3.0000 mL | Freq: Two times a day (BID) | RESPIRATORY_TRACT | Status: DC
  Administered 2021-10-12 – 2021-10-13 (×2): 3 mL via RESPIRATORY_TRACT
  Filled 2021-10-12 (×3): qty 3

## 2021-10-12 NOTE — TOC Initial Note (Signed)
Transition of Care [facility]) - Initial/Assessment Note    Patient Details  Name: Vincent Richmond MRN: [id number] Date of Birth: July 12, 1946  Transition of [facility]) CM/SW Contact:    Lawerance Sabal, RN Phone Number: 10/12/2021, 3:33 PM  Clinical Narrative:                    Expected Discharge Plan: Home w Home Health Services Barriers to Discharge: Continued Medical Work up   Patient Goals and CMS Choice Patient states their goals for this hospitalization and ongoing recovery are:: to go home CMS Medicare.gov Compare Post Acute Care list provided to:: Other (Comment Required) Choice offered to / list presented to : Spouse  Expected Discharge Plan and Services Expected Discharge Plan: Home w Home Health Services   Discharge Planning Services: CM Consult Post Acute Care Choice: Home Health                             HH Arranged: PT, OT, RN [facility] Agency: [facility] Date [facility] Agency Contacted: 10/12/21 Time HH Agency Contacted: 1532 Representative spoke with at [facility] Agency: Kandee Keen  Prior Living Arrangements/Services   Lives with:: Spouse   Do you feel safe going back to the place where you live?: Yes               Activities of Daily Living Home Assistive Devices/Equipment: Cane (specify quad or straight) ADL Screening (condition at time of admission) Patient's cognitive ability adequate to safely complete daily activities?: Yes Is the patient deaf or have difficulty hearing?: Yes Does the patient have difficulty seeing, even when wearing glasses/contacts?: No Does the patient have difficulty concentrating, remembering, or making decisions?: No Patient able to express need for assistance with ADLs?: Yes Does the patient have difficulty dressing or bathing?: No Independently performs ADLs?: Yes (appropriate for developmental age) Does the patient have difficulty walking or climbing stairs?: Yes Weakness of Legs: Both Weakness of Arms/Hands: Both  Permission  Sought/Granted                  Emotional Assessment              Admission diagnosis:  COPD exacerbation (HCC) [J44.1] Acute respiratory failure with hypoxia (HCC) [J96.01] Patient Active Problem List   Diagnosis Date Noted   Acute respiratory distress 10/08/2021   COPD exacerbation (HCC) 09/14/2021   Leukocytosis 09/14/2021   COPD (chronic obstructive pulmonary disease) (HCC) 09/14/2021   GERD (gastroesophageal reflux disease) 11/23/2019   Allergic rhinitis 07/25/2019   Asthma-COPD overlap syndrome (HCC) 12/20/2018   OSA (obstructive sleep apnea) 12/20/2018   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 [address] ([address]), [place] - [address] [place] ([place]) [place] [address] Phone: [phone] Fax: [phone]  [facility] - [place], [place] - [address] [place] [address] Phone: [phone] Fax: [phone]  [facility] [address] [place] [address] Phone: [phone] Fax: [phone]     Social Determinants of Health (SDOH) Interventions    Readmission Risk Interventions No flowsheet data found.

## 2021-10-12 NOTE — Progress Notes (Signed)
PROGRESS NOTE    Vincent Richmond  ZOX:[id number] DOB: 1946/09/05 DOA: 10/07/2021 PCP: [facility]  Brief Narrative: 75/M with history of prior tobacco abuse, type 2 diabetes mellitus, OSA, obesity, hypertension, was recently hospitalized with asthma/COPD exacerbation, at that time treated with steroids duo nebs antibiotics etc. and discharged home on Medrol Dosepak, did well initially, after which he had recurrence of cough wheezing and shortness of breath and hence presented back to the ER. -Complains of productive cough, wheezing, also noticed some leg swelling and PND. -In the ED he was noted to be hypoxic to 91%, placed on 2 L O2 for distress, labs noted BNP of 21, troponin was negative, chest x-ray noted possible pulmonary edema, influenza and COVID PCR were negative   Assessment & Plan:   Acute hypoxic respiratory failure Asthma/COPD overlap syndrome with exacerbation Community-acquired pneumonia -CT chest with bilateral airspace disease, COVID and influenza PCR negative -Clinically improving today, still with moderate wheezing -Continue prednisone, oral cefdinir, duo nebs -Weaned off O2 -Ambulate, discharge planning  Acute diastolic CHF -Had trace edema on admission 1/3, resolved now, -2D echo notes preserved EF, diastolic parameters could not be assessed, no significant valvular disease  -Diuresed with IV Lasix earlier this admission, off diuretics now -Clinically euvolemic  Type 2 diabetes mellitus -Continue home regimen of Semglee, increased dose to 60 units, also on Ozempic and metformin at baseline,  CBGs remain elevated, increase meal coverage insulin  Essential hypertension -Continue metoprolol  PTSD -Continue prazosin  OSA -Continue CPAP nightly  DVT prophylaxis: Lovenox Code Status: Full code Family Communication: Discussed with patient in detail, no family at bedside Disposition Plan:  Status is: Inpatient  Remains inpatient appropriate because:  Severity of illness   Consultants:    Procedures:   Antimicrobials:    Subjective: -Feels better overall, breathing is improving, still with productive cough and some wheezing  Objective: Vitals:   10/11/21 2059 10/12/21 0314 10/12/21 0727 10/12/21 1112  BP:  (!) 146/58  (!) 154/51  Pulse:  77 68 77  Resp:  18 18 18   Temp:  97.7 F (36.5 C)  97.8 F (36.6 C)  TempSrc:  Oral  Oral  SpO2: 94% 95% 95% 94%  Weight:  113.2 kg    Height:        Intake/Output Summary (Last 24 hours) at 10/12/2021 1244 Last data filed at 10/12/2021 1244 Gross per 24 hour  Intake 480 ml  Output 1000 ml  Net -520 ml   Filed Weights   10/10/21 0327 10/11/21 0450 10/12/21 0314  Weight: 113.3 kg 112.8 kg 113.2 kg    Examination:  Pleasant male sitting up in bed, AAOx3, no distress HEENT: No JVD CVS: S1-S2, regular rate rhythm Lungs: Improving air movement, scant expiratory wheezes noted Abdomen: Soft, nontender, bowel sounds present Extremities: No edema  Skin: No rash on exposed skin  Data Reviewed:   CBC: Recent Labs  Lab 10/07/21 2111 10/09/21 0109 10/10/21 0408  WBC 11.2* 13.5* 13.5*  NEUTROABS 7.9*  --   --   HGB 13.0 13.7 13.1  HCT 39.6 39.6 39.9  MCV 88.8 86.5 88.5  PLT 286 345 306   Basic Metabolic Panel: Recent Labs  Lab 10/07/21 2111 10/09/21 0109 10/10/21 0408 10/12/21 0330  NA 134* 136 133* 135  K 4.3 3.8 3.8 4.7  CL 98 98 100 102  CO2 27 26 26 25   GLUCOSE 205* 179* 173* 297*  BUN 10 11 22 20   CREATININE 0.82 0.85 1.03 0.91  CALCIUM 8.9 9.3 8.7* 8.6*   GFR: Estimated Creatinine Clearance: 88.4 mL/min (by C-G formula based on SCr of 0.91 mg/dL). Liver Function Tests: Recent Labs  Lab 10/08/21 0511  AST 17  ALT 19  ALKPHOS 67  BILITOT 0.7  PROT 6.6  ALBUMIN 3.7   No results for input(s): LIPASE, AMYLASE in the last 168 hours. No results for input(s): AMMONIA in the last 168 hours. Coagulation Profile: No results for input(s): INR, PROTIME in  the last 168 hours. Cardiac Enzymes: No results for input(s): CKTOTAL, CKMB, CKMBINDEX, TROPONINI in the last 168 hours. BNP (last 3 results) No results for input(s): PROBNP in the last 8760 hours. HbA1C: No results for input(s): HGBA1C in the last 72 hours. CBG: Recent Labs  Lab 10/11/21 1205 10/11/21 1544 10/11/21 2114 10/12/21 0527 10/12/21 1140  GLUCAP 154* 251* 204* 289* 294*   Lipid Profile: No results for input(s): CHOL, HDL, LDLCALC, TRIG, CHOLHDL, LDLDIRECT in the last 72 hours. Thyroid Function Tests: No results for input(s): TSH, T4TOTAL, FREET4, T3FREE, THYROIDAB in the last 72 hours. Anemia Panel: No results for input(s): VITAMINB12, FOLATE, FERRITIN, TIBC, IRON, RETICCTPCT in the last 72 hours. Urine analysis: No results found for: COLORURINE, APPEARANCEUR, LABSPEC, PHURINE, GLUCOSEU, HGBUR, BILIRUBINUR, KETONESUR, PROTEINUR, UROBILINOGEN, NITRITE, LEUKOCYTESUR Sepsis Labs: @LABRCNTIP (procalcitonin:4,lacticidven:4)  ) Recent Results (from the past 240 hour(s))  Resp Panel by RT-PCR (Flu A&B, Covid) Nasopharyngeal Swab     Status: None   Collection Time: 10/08/21  5:01 AM   Specimen: Nasopharyngeal Swab; Nasopharyngeal(NP) swabs in vial transport medium  Result Value Ref Range Status   SARS Coronavirus 2 by RT PCR NEGATIVE NEGATIVE Final    Comment: (NOTE) SARS-CoV-2 target nucleic acids are NOT DETECTED.  The SARS-CoV-2 RNA is generally detectable in upper respiratory specimens during the acute phase of infection. The lowest concentration of SARS-CoV-2 viral copies this assay can detect is 138 copies/mL. A negative result does not preclude SARS-Cov-2 infection and should not be used as the sole basis for treatment or other patient management decisions. A negative result may occur with  improper specimen collection/handling, submission of specimen other than nasopharyngeal swab, presence of viral mutation(s) within the areas targeted by this assay, and  inadequate number of viral copies(<138 copies/mL). A negative result must be combined with clinical observations, patient history, and epidemiological information. The expected result is Negative.  Fact Sheet for Patients:  BloggerCourse.comhttps://www.fda.gov/media/152166/download  Fact Sheet for Healthcare Providers:  SeriousBroker.ithttps://www.fda.gov/media/152162/download  This test is no t yet approved or cleared by the [place] FDA and  has been authorized for detection and/or diagnosis of SARS-CoV-2 by FDA under an Emergency Use Authorization (EUA). This EUA will remain  in effect (meaning this test can be used) for the duration of the COVID-19 declaration under Section 564(b)(1) of the Act, 21 U.S.C.section 360bbb-3(b)(1), unless the authorization is terminated  or revoked sooner.       Influenza A by PCR NEGATIVE NEGATIVE Final   Influenza B by PCR NEGATIVE NEGATIVE Final    Comment: (NOTE) The Xpert Xpress SARS-CoV-2/FLU/RSV plus assay is intended as an aid in the diagnosis of influenza from Nasopharyngeal swab specimens and should not be used as a sole basis for treatment. Nasal washings and aspirates are unacceptable for Xpert Xpress SARS-CoV-2/FLU/RSV testing.  Fact Sheet for Patients: BloggerCourse.comhttps://www.fda.gov/media/152166/download  Fact Sheet for Healthcare Providers: SeriousBroker.ithttps://www.fda.gov/media/152162/download  This test is not yet approved or cleared by the [place] FDA and has been authorized for detection and/or diagnosis of SARS-CoV-2 by FDA  under an Emergency Use Authorization (EUA). This EUA will remain in effect (meaning this test can be used) for the duration of the COVID-19 declaration under Section 564(b)(1) of the Act, 21 U.S.C. section 360bbb-3(b)(1), unless the authorization is terminated or revoked.  Performed at [facility], [address], [place], [place] [address]      Scheduled Meds:  arformoterol  15 mcg Nebulization BID   aspirin EC  325 mg Oral Daily    budesonide (PULMICORT) nebulizer solution  0.5 mg Nebulization BID   cefdinir  300 mg Oral Q12H   enoxaparin (LOVENOX) injection  40 mg Subcutaneous Q24H   guaiFENesin  600 mg Oral BID   insulin aspart  0-15 Units Subcutaneous TID WC   insulin aspart  6 Units Subcutaneous TID WC   insulin glargine-yfgn  60 Units Subcutaneous QHS   ipratropium-albuterol  3 mL Nebulization BID   metoprolol tartrate  25 mg Oral BID   Oxcarbazepine  450 mg Oral BID   pantoprazole  40 mg Oral Daily   pramipexole  0.5 mg Oral QHS   prazosin  15 mg Oral QHS   predniSONE  40 mg Oral Q breakfast   pregabalin  300 mg Oral BID   rosuvastatin  10 mg Oral Once per day on Mon Wed Fri   Continuous Infusions:   LOS: 4 days    Time spent:  Zannie Cove, MD [facility]   10/12/2021, 12:44 PM

## 2021-10-12 NOTE — Progress Notes (Signed)
Mobility Specialist Progress Note:   10/12/21 1110  Mobility  Activity Ambulated in hall  Level of Assistance Modified independent, requires aide device or extra time  Assistive Device Front wheel walker  Distance Ambulated (ft) 800 ft  Mobility Ambulated with assistance in hallway  Mobility Response Tolerated well  Mobility performed by Mobility specialist  Bed Position Chair  $Mobility charge 1 Mobility   Pt received in bed willing to participate in mobility. No complaints of pain. Pt returned to chair with call bell in reach and all needs met.   [facility] Public librarian Phone [phone] Secondary Phone [phone]

## 2021-10-13 LAB — GLUCOSE, CAPILLARY
Glucose-Capillary: 215 mg/dL — ABNORMAL HIGH (ref 70–99)
Glucose-Capillary: 296 mg/dL — ABNORMAL HIGH (ref 70–99)

## 2021-10-13 MED ORDER — CEFDINIR 300 MG PO CAPS: 300.0000 mg | ORAL_CAPSULE | Freq: Two times a day (BID) | ORAL | 0 refills | Status: AC

## 2021-10-13 MED ORDER — GUAIFENESIN ER 600 MG PO TB12: 600.0000 mg | ORAL_TABLET | Freq: Two times a day (BID) | ORAL | 0 refills | Status: AC

## 2021-10-13 MED ORDER — ALBUTEROL SULFATE HFA 108 (90 BASE) MCG/ACT IN AERS: 2.0000 | INHALATION_SPRAY | Freq: Four times a day (QID) | RESPIRATORY_TRACT | Status: AC | PRN

## 2021-10-13 MED ORDER — FLUTICASONE-SALMETEROL 100-50 MCG/ACT IN AEPB: 1.0000 | INHALATION_SPRAY | Freq: Two times a day (BID) | RESPIRATORY_TRACT | Status: AC

## 2021-10-13 MED ORDER — PREDNISONE 20 MG PO TABS
20.0000 mg | ORAL_TABLET | Freq: Every day | ORAL | 0 refills | Status: DC
Start: 2021-10-14 — End: 2022-01-25

## 2021-10-13 NOTE — Discharge Summary (Signed)
Physician Discharge Summary  Vincent Richmond ZOX:[id number] DOB: 1946/07/22 DOA: 10/07/2021  PCP: [facility]  Admit date: 10/07/2021 Discharge date: 10/13/2021  Time spent: 35 minutes  Recommendations for Outpatient Follow-up:  PCP in 1 week, assess need for diuretics at follow-up Pulmonary follow-up in 1 month, referral sent   Discharge Diagnoses:  Principal Problem: Acute hypoxic respiratory failure Asthma/COPD overlap syndrome with exacerbation Community-acquired pneumonia   PTSD (post-traumatic stress disorder)   Diabetes mellitus without complication (HCC)   HTN (hypertension)   Asthma-COPD overlap syndrome (HCC)   OSA (obstructive sleep apnea)   GERD (gastroesophageal reflux disease)   Leukocytosis Leg edema  Discharge Condition: Stable  Diet recommendation: Low-sodium, heart healthy  Filed Weights   10/11/21 0450 10/12/21 0314 10/13/21 0616  Weight: 112.8 kg 113.2 kg 114.4 kg    History of present illness:  76/M with history of prior tobacco abuse, type 2 diabetes mellitus, OSA, obesity, hypertension, was recently hospitalized with asthma/COPD exacerbation, at that time treated with steroids duo nebs antibiotics etc. and discharged home on Medrol Dosepak, did well initially, after which he had recurrence of cough wheezing and shortness of breath and hence presented back to the ER. -Complains of productive cough, wheezing, also noticed some leg swelling and PND. -In the ED he was noted to be hypoxic to 91%, placed on 2 L O2 for distress, labs noted BNP of 21, troponin was negative, chest x-ray noted possible pulmonary edema, influenza and COVID PCR were negative      Hospital Course:   Acute hypoxic respiratory failure Asthma/COPD overlap syndrome with exacerbation Community-acquired pneumonia -CT chest with bilateral airspace disease, COVID and influenza PCR negative -On admission was hypoxic with diffuse rhonchi and expiratory wheezes  -Treated with IV  steroids, IV ceftriaxone/azithromycin, duo nebs, Mucinex  -Clinically improving slowly, transitioned to prednisone taper, oral cefdinir  -Weaned off oxygen  -Discharged home in stable condition on prednisone taper and oral antibiotics for few more days, needs follow-up with PCP in 1 week and pulmonary in 1 month   Leg edema -Had trace edema on admission 1/3, resolved now, -2D echo notes preserved EF, diastolic parameters could not be assessed, no significant valvular disease  -Diuresed with IV Lasix earlier this admission, off diuretics now -Clinically remained euvolemic  the rest of this admission, I do not think he needs diuretics at discharge, need to assess, monitor volume status at follow-up in   Type 2 diabetes mellitus -Continue home regimen of Semglee, Ozempic and metformin at baseline   Essential hypertension -Continue metoprolol   PTSD -Continue prazosin   OSA -Continue CPAP nightly     Discharge Exam: Vitals:   10/13/21 0618 10/13/21 1057  BP:  (!) 153/72  Pulse: 64 78  Resp:  19  Temp:  97.8 F (36.6 C)  SpO2: 91% 90%   Pleasant male sitting up in bed, AAOx3, no distress HEENT: No JVD CVS: S1-S2, regular rate rhythm Lungs: Improving air movement, scant expiratory wheezes noted Abdomen: Soft, nontender, bowel sounds present Extremities: No edema  Skin: No rash on exposed skin   Discharge Instructions   Discharge Instructions     Ambulatory referral to Pulmonology   Complete by: As directed    Reason for referral: Asthma/COPD   Diet - low sodium heart healthy   Complete by: As directed    Increase activity slowly   Complete by: As directed       Allergies as of 10/13/2021       Reactions  Pregabalin Diarrhea   Pt takes this at home daily   Statins Other (See Comments)   Severe muscle pains        Medication List     STOP taking these medications    benzonatate 100 MG capsule Commonly known as: TESSALON   chlorpheniramine 4 MG  tablet Commonly known as: CHLOR-TRIMETON   methylPREDNISolone 4 MG Tbpk tablet Commonly known as: MEDROL DOSEPAK       TAKE these medications    albuterol 108 (90 Base) MCG/ACT inhaler Commonly known as: VENTOLIN HFA Inhale 2 puffs into the lungs every 6 (six) hours as needed for wheezing or shortness of breath. Use this 3-4 times a day for next 3days then use as needed What changed: additional instructions   ALPHA LIPOIC ACID PO Take 1 tablet by mouth daily.   aspirin EC 325 MG tablet Take 325 mg by mouth daily.   buPROPion 300 MG 24 hr tablet Commonly known as: WELLBUTRIN XL Take 300 mg by mouth daily.   cefdinir 300 MG capsule Commonly known as: OMNICEF Take 1 capsule (300 mg total) by mouth every 12 (twelve) hours for 3 days.   CENTRUM SILVER ADULT 50+ PO Take 1 tablet by mouth daily.   cholecalciferol 1000 units tablet Commonly known as: VITAMIN D Take 1,000 Units by mouth daily.   Fish Oil 1000 MG Caps Take 1,000 mg by mouth daily.   FLUoxetine 20 MG capsule Commonly known as: PROZAC Take 80 mg by mouth daily. For depression and anxiety -4 tabs in am   fluticasone-salmeterol 100-50 MCG/ACT Aepb Commonly known as: Wixela Inhub Inhale 1 puff into the lungs 2 (two) times daily. What changed:  when to take this reasons to take this   guaiFENesin 600 MG 12 hr tablet Commonly known as: MUCINEX Take 1 tablet (600 mg total) by mouth 2 (two) times daily for 5 days.   insulin glargine-yfgn 100 UNIT/ML Pen Commonly known as: SEMGLEE Inject 50 Units into the skin at bedtime.   loperamide 2 MG capsule Commonly known as: IMODIUM Take 4 mg by mouth as needed for diarrhea or loose stools.   metFORMIN 1000 MG tablet Commonly known as: GLUCOPHAGE Take 1,000 mg by mouth 2 (two) times daily with a meal.   metoprolol tartrate 25 MG tablet Commonly known as: LOPRESSOR Take 1 tablet (25 mg total) by mouth 2 (two) times daily.   modafinil 100 MG tablet Commonly  known as: PROVIGIL Take 100 mg by mouth 3 (three) times daily.   NON FORMULARY CPAP Machine daily   omeprazole 20 MG capsule Commonly known as: PRILOSEC Take 20 mg by mouth daily.   Oxcarbazepine 300 MG tablet Commonly known as: TRILEPTAL Take 450 mg by mouth 2 (two) times daily.   OZEMPIC (0.25 OR 0.5 MG/DOSE) [place] Inject 0.5 mg into the skin once a week.   pramipexole 0.5 MG tablet Commonly known as: MIRAPEX Take 1 tablet (0.5 mg total) by mouth at bedtime.   prazosin 5 MG capsule Commonly known as: MINIPRESS Take 15 mg by mouth at bedtime. For nightmares and flashbacks   predniSONE 20 MG tablet Commonly known as: DELTASONE Take 1-2 tablets (20-40 mg total) by mouth daily with breakfast. Take 40mg  daily for 2days then 20mg  daily for 2days then STOP Start taking on: October 14, 2021   pregabalin 300 MG capsule Commonly known as: LYRICA Take 300 mg by mouth 2 (two) times daily.   rosuvastatin 20 MG tablet Commonly known as: CRESTOR Take  10 mg by mouth 3 (three) times a week. Monday, Wednesday, Friday   VITAMIN C PO Take 1,000 mg by mouth daily.       Allergies  Allergen Reactions   Pregabalin Diarrhea    Pt takes this at home daily   Statins Other (See Comments)    Severe muscle pains    Follow-up Information     Concha PyoSalman, Faiza, MD Follow up on 10/17/2021.   Specialty: Family Medicine Contact information: [address] [place] [id number] [phone]         [facility] Follow up in 1 month(s).   Specialty: Pulmonology Why: referral sent Contact information: [address] [place] [phone]                 The results of significant diagnostics from this hospitalization (including imaging, microbiology, ancillary and laboratory) are listed below for reference.    Significant Diagnostic Studies: DG Chest 2 View  Result Date: 10/07/2021 CLINICAL DATA:  shortness of breath EXAM: CHEST  - 2 VIEW COMPARISON:  Chest x-ray 09/14/2021 FINDINGS: The heart and mediastinal contours are unchanged. Slightly prominent hilar vasculature. No focal consolidation. Slightly increased interstitial markings. No pleural effusion. No pneumothorax. No acute osseous abnormality. IMPRESSION: Possible mild pulmonary edema. Electronically Signed   By: Tish FredericksonMorgane  Naveau M.D.   On: 10/07/2021 21:38   CT CHEST WO CONTRAST  Result Date: 10/08/2021 CLINICAL DATA:  Respiratory illness, chronic shortness of breath and wheezing. EXAM: CT CHEST WITHOUT CONTRAST TECHNIQUE: Multidetector CT imaging of the chest was performed following the standard protocol without IV contrast. COMPARISON:  10/07/2021 FINDINGS: Cardiovascular: The heart is normal in size and there is no pericardial effusion. Scattered coronary artery calcifications are present. Mild aortic atherosclerosis without evidence of aneurysm. The pulmonary trunk is normal in caliber. Mediastinum/Nodes: No enlarged mediastinal or axillary lymph nodes. Thyroid gland, trachea, and esophagus demonstrate no significant findings. Lungs/Pleura: Scattered ground-glass opacities are present in the lungs bilaterally. No effusion or pneumothorax. Mild bronchiectasis is noted. Upper Abdomen: No acute abnormality. Musculoskeletal: Degenerative changes are present in the thoracic spine. No acute or suspicious osseous abnormality. IMPRESSION: 1. Bronchiectasis with scattered ground-glass opacities in the lungs bilaterally, possible pneumonitis. 2. Aortic atherosclerosis. 3. Coronary artery calcifications. Electronically Signed   By: Thornell SartoriusLaura  Taylor M.D.   On: 10/08/2021 22:18   DG Chest Portable 1 View  Result Date: 09/14/2021 CLINICAL DATA:  Shortness of breath. EXAM: PORTABLE CHEST 1 VIEW COMPARISON:  10/30/2018. FINDINGS: The heart is enlarged and the mediastinal structures are within normal limits. No consolidation, effusion, or pneumothorax. Degenerative changes are present in the  thoracic spine. No acute osseous abnormality. IMPRESSION: Cardiomegaly with no acute process. Electronically Signed   By: Thornell SartoriusLaura  Taylor M.D.   On: 09/14/2021 01:37   ECHOCARDIOGRAM COMPLETE  Result Date: 10/08/2021    ECHOCARDIOGRAM REPORT   Patient Name:   Dorisann FramesWILLIAM Prell Date of Exam: 10/08/2021 Medical Rec #:  [id number]      Height:       71.0 in Accession #:    [id number]     Weight:       274.9 lb Date of Birth:  12/26/1945      BSA:          2.414 m Patient Age:    75 years       BP:           178/76 mmHg Patient Gender: M  HR:           95 bpm. Exam Location:  Inpatient Procedure: 2D Echo, Cardiac Doppler and Color Doppler Indications:    CHF  History:        Patient has no prior history of Echocardiogram examinations.                 COPD; Risk Factors:Hypertension and Diabetes.  Sonographer:    Mikki Harbor Referring Phys: [id number] RONDELL A SMITH IMPRESSIONS  1. Left ventricular ejection fraction, by estimation, is 65 to 70%. The left ventricle has normal function. The left ventricle has no regional wall motion abnormalities. There is mild concentric left ventricular hypertrophy. Left ventricular diastolic parameters are indeterminate.  2. Right ventricular systolic function is normal. The right ventricular size is mildly enlarged. Tricuspid regurgitation signal is inadequate for assessing PA pressure.  3. The mitral valve is normal in structure. No evidence of mitral valve regurgitation. No evidence of mitral stenosis.  4. The aortic valve has an indeterminant number of cusps. Aortic valve regurgitation is not visualized. No aortic stenosis is present.  5. The inferior vena cava is normal in size with greater than 50% respiratory variability, suggesting right atrial pressure of 3 mmHg. Comparison(s): No prior Echocardiogram. FINDINGS  Left Ventricle: Left ventricular ejection fraction, by estimation, is 65 to 70%. The left ventricle has normal function. The left ventricle has no regional  wall motion abnormalities. The left ventricular internal cavity size was small. There is mild concentric left ventricular hypertrophy. Left ventricular diastolic parameters are indeterminate. Right Ventricle: The right ventricular size is mildly enlarged. No increase in right ventricular wall thickness. Right ventricular systolic function is normal. Tricuspid regurgitation signal is inadequate for assessing PA pressure. Left Atrium: Left atrial size was normal in size. Right Atrium: Right atrial size was normal in size. Pericardium: There is no evidence of pericardial effusion. Presence of epicardial fat layer. Mitral Valve: The mitral valve is normal in structure. No evidence of mitral valve regurgitation. No evidence of mitral valve stenosis. MV peak gradient, 2.3 mmHg. The mean mitral valve gradient is 1.0 mmHg. Tricuspid Valve: The tricuspid valve is normal in structure. Tricuspid valve regurgitation is not demonstrated. No evidence of tricuspid stenosis. Aortic Valve: The aortic valve has an indeterminant number of cusps. Aortic valve regurgitation is not visualized. No aortic stenosis is present. Aortic valve mean gradient measures 3.0 mmHg. Aortic valve peak gradient measures 6.2 mmHg. Aortic valve area, by VTI measures 2.77 cm. Pulmonic Valve: The pulmonic valve was normal in structure. Pulmonic valve regurgitation is not visualized. No evidence of pulmonic stenosis. Aorta: The aortic root is normal in size and structure. Venous: The inferior vena cava is normal in size with greater than 50% respiratory variability, suggesting right atrial pressure of 3 mmHg. IAS/Shunts: No atrial level shunt detected by color flow Doppler.  LEFT VENTRICLE PLAX 2D LVIDd:         4.00 cm LVIDs:         2.30 cm LV PW:         1.30 cm LV IVS:        1.40 cm LVOT diam:     2.10 cm LV SV:         67 LV SV Index:   28 LVOT Area:     3.46 cm  RIGHT VENTRICLE RV Basal diam:  3.80 cm RV Mid diam:    3.10 cm RV S prime:     13.90  cm/s TAPSE (M-mode):  2.6 cm LEFT ATRIUM             Index        RIGHT ATRIUM           Index LA diam:        3.70 cm 1.53 cm/m   RA Area:     16.50 cm LA Vol (A2C):   50.2 ml 20.80 ml/m  RA Volume:   38.20 ml  15.83 ml/m LA Vol (A4C):   31.8 ml 13.17 ml/m LA Biplane Vol: 41.0 ml 16.99 ml/m  AORTIC VALVE                    PULMONIC VALVE AV Area (Vmax):    3.04 cm     PV Vmax:       1.20 m/s AV Area (Vmean):   2.65 cm     PV Peak grad:  5.8 mmHg AV Area (VTI):     2.77 cm AV Vmax:           124.00 cm/s AV Vmean:          84.400 cm/s AV VTI:            0.241 m AV Peak Grad:      6.2 mmHg AV Mean Grad:      3.0 mmHg LVOT Vmax:         109.00 cm/s LVOT Vmean:        64.500 cm/s LVOT VTI:          0.193 m LVOT/AV VTI ratio: 0.80  AORTA Ao Root diam: 3.40 cm MITRAL VALVE MV Area (PHT): 4.06 cm    SHUNTS MV Area VTI:   2.64 cm    Systemic VTI:  0.19 m MV Peak grad:  2.3 mmHg    Systemic Diam: 2.10 cm MV Mean grad:  1.0 mmHg MV Vmax:       0.76 m/s MV Vmean:      47.4 cm/s MV Decel Time: 187 msec MV E velocity: 72.40 cm/s MV A velocity: 87.00 cm/s MV E/A ratio:  0.83 Kardie Tobb DO Electronically signed by Thomasene Ripple DO Signature Date/Time: 10/08/2021/2:18:04 PM    Final     Microbiology: Recent Results (from the past 240 hour(s))  Resp Panel by RT-PCR (Flu A&B, Covid) Nasopharyngeal Swab     Status: None   Collection Time: 10/08/21  5:01 AM   Specimen: Nasopharyngeal Swab; Nasopharyngeal(NP) swabs in vial transport medium  Result Value Ref Range Status   SARS Coronavirus 2 by RT PCR NEGATIVE NEGATIVE Final    Comment: (NOTE) SARS-CoV-2 target nucleic acids are NOT DETECTED.  The SARS-CoV-2 RNA is generally detectable in upper respiratory specimens during the acute phase of infection. The lowest concentration of SARS-CoV-2 viral copies this assay can detect is 138 copies/mL. A negative result does not preclude SARS-Cov-2 infection and should not be used as the sole basis for treatment or other  patient management decisions. A negative result may occur with  improper specimen collection/handling, submission of specimen other than nasopharyngeal swab, presence of viral mutation(s) within the areas targeted by this assay, and inadequate number of viral copies(<138 copies/mL). A negative result must be combined with clinical observations, patient history, and epidemiological information. The expected result is Negative.  Fact Sheet for Patients:  BloggerCourse.com  Fact Sheet for Healthcare Providers:  SeriousBroker.it  This test is no t yet approved or cleared by the [place] FDA and  has been authorized for detection  and/or diagnosis of SARS-CoV-2 by FDA under an Emergency Use Authorization (EUA). This EUA will remain  in effect (meaning this test can be used) for the duration of the COVID-19 declaration under Section 564(b)(1) of the Act, 21 U.S.C.section 360bbb-3(b)(1), unless the authorization is terminated  or revoked sooner.       Influenza A by PCR NEGATIVE NEGATIVE Final   Influenza B by PCR NEGATIVE NEGATIVE Final    Comment: (NOTE) The Xpert Xpress SARS-CoV-2/FLU/RSV plus assay is intended as an aid in the diagnosis of influenza from Nasopharyngeal swab specimens and should not be used as a sole basis for treatment. Nasal washings and aspirates are unacceptable for Xpert Xpress SARS-CoV-2/FLU/RSV testing.  Fact Sheet for Patients: BloggerCourse.com  Fact Sheet for Healthcare Providers: SeriousBroker.it  This test is not yet approved or cleared by the [place] FDA and has been authorized for detection and/or diagnosis of SARS-CoV-2 by FDA under an Emergency Use Authorization (EUA). This EUA will remain in effect (meaning this test can be used) for the duration of the COVID-19 declaration under Section 564(b)(1) of the Act, 21 U.S.C. section  360bbb-3(b)(1), unless the authorization is terminated or revoked.  Performed at [facility], [address] [place], [place] [address]      Labs: Basic Metabolic Panel: Recent Labs  Lab 10/07/21 2111 10/09/21 0109 10/10/21 0408 10/12/21 0330  NA 134* 136 133* 135  K 4.3 3.8 3.8 4.7  CL 98 98 100 102  CO2 27 26 26 25   GLUCOSE 205* 179* 173* 297*  BUN 10 11 22 20   CREATININE 0.82 0.85 1.03 0.91  CALCIUM 8.9 9.3 8.7* 8.6*   Liver Function Tests: Recent Labs  Lab 10/08/21 0511  AST 17  ALT 19  ALKPHOS 67  BILITOT 0.7  PROT 6.6  ALBUMIN 3.7   No results for input(s): LIPASE, AMYLASE in the last 168 hours. No results for input(s): AMMONIA in the last 168 hours. CBC: Recent Labs  Lab 10/07/21 2111 10/09/21 0109 10/10/21 0408  WBC 11.2* 13.5* 13.5*  NEUTROABS 7.9*  --   --   HGB 13.0 13.7 13.1  HCT 39.6 39.6 39.9  MCV 88.8 86.5 88.5  PLT 286 345 306   Cardiac Enzymes: No results for input(s): CKTOTAL, CKMB, CKMBINDEX, TROPONINI in the last 168 hours. BNP: BNP (last 3 results) Recent Labs    10/08/21 0511  BNP 21.5    ProBNP (last 3 results) No results for input(s): PROBNP in the last 8760 hours.  CBG: Recent Labs  Lab 10/12/21 1140 10/12/21 1623 10/12/21 2121 10/13/21 0620 10/13/21 1054  GLUCAP 294* 260* 134* 215* 296*       Signed:  12/11/21 MD.  [facility] 10/13/2021, 1:30 PM

## 2021-10-13 NOTE — TOC Transition Note (Signed)
Transition of Care [facility]) - CM/SW Discharge Note   Patient Details  Name: Darrie Macmillan MRN: [id number] Date of Birth: 27-Aug-1946  [facility]) CM/SW Contact:  Lawerance Sabal, RN Phone Number: 10/13/2021, 10:39 AM   Clinical Narrative:    Rondel Jumbo that patient will DC today, no DME needs. Wife will transport home      Barriers to Discharge: Continued Medical Work up   Patient Goals and CMS Choice Patient states their goals for this hospitalization and ongoing recovery are:: to go home CMS Medicare.gov Compare Post Acute Care list provided to:: Other (Comment Required) Choice offered to / list presented to : Spouse  Discharge Placement                       Discharge Plan and Services   Discharge Planning Services: CM Consult Post Acute Care Choice: Home Health                    HH Arranged: PT, OT, RN [facility] Agency: [facility] Health Care Date [facility] Agency Contacted: 10/12/21 Time HH Agency Contacted: 1532 Representative spoke with at [facility] Agency: Kandee Keen  Social Determinants of Health (SDOH) Interventions     Readmission Risk Interventions No flowsheet data found.

## 2021-10-13 NOTE — Progress Notes (Signed)
Mobility Specialist Progress Note:   10/13/21 1053  Mobility  Activity Ambulated in hall  Level of Assistance Modified independent, requires aide device or extra time  Assistive Device Front wheel walker  Distance Ambulated (ft) 520 ft  Mobility Ambulated with assistance in hallway  Mobility Response Tolerated well  Mobility performed by Mobility specialist  Bed Position Chair  $Mobility charge 1 Mobility   Pt received in bed willing to participate in mobility. No complaints of pain. Pt returned to chair with call bell in reach and all needs met.   [facility] Public librarian Phone [phone] Secondary Phone [phone]

## 2021-10-13 NOTE — Plan of Care (Signed)
Problem: Pain Managment: Goal: General experience of comfort will improve Outcome: Completed/Met   Problem: Elimination: Goal: Will not experience complications related to urinary retention Outcome: Completed/Met   Problem: Nutrition: Goal: Adequate nutrition will be maintained Outcome: Completed/Met   Problem: Clinical Measurements: Goal: Will remain free from infection Outcome: Completed/Met

## 2021-10-30 DIAGNOSIS — Z008 Encounter for other general examination: Secondary | ICD-10-CM | POA: Diagnosis not present

## 2021-10-30 DIAGNOSIS — E1142 Type 2 diabetes mellitus with diabetic polyneuropathy: Secondary | ICD-10-CM | POA: Diagnosis not present

## 2021-10-30 DIAGNOSIS — R69 Illness, unspecified: Secondary | ICD-10-CM | POA: Diagnosis not present

## 2021-10-30 DIAGNOSIS — Z794 Long term (current) use of insulin: Secondary | ICD-10-CM | POA: Diagnosis not present

## 2021-10-30 DIAGNOSIS — E785 Hyperlipidemia, unspecified: Secondary | ICD-10-CM | POA: Diagnosis not present

## 2021-10-30 DIAGNOSIS — G2581 Restless legs syndrome: Secondary | ICD-10-CM | POA: Diagnosis not present

## 2021-10-30 DIAGNOSIS — J449 Chronic obstructive pulmonary disease, unspecified: Secondary | ICD-10-CM | POA: Diagnosis not present

## 2021-10-30 DIAGNOSIS — I10 Essential (primary) hypertension: Secondary | ICD-10-CM | POA: Diagnosis not present

## 2021-10-30 DIAGNOSIS — G8929 Other chronic pain: Secondary | ICD-10-CM | POA: Diagnosis not present

## 2021-10-30 DIAGNOSIS — G4733 Obstructive sleep apnea (adult) (pediatric): Secondary | ICD-10-CM | POA: Diagnosis not present

## 2021-11-20 ENCOUNTER — Other Ambulatory Visit: Payer: Self-pay

## 2021-11-20 ENCOUNTER — Ambulatory Visit (INDEPENDENT_AMBULATORY_CARE_PROVIDER_SITE_OTHER): Payer: Medicare HMO | Admitting: Emergency Medicine

## 2021-11-20 ENCOUNTER — Ambulatory Visit: Payer: No Typology Code available for payment source

## 2021-11-20 ENCOUNTER — Encounter: Payer: Self-pay | Admitting: Emergency Medicine

## 2021-11-20 VITALS — BP 128/80 | HR 74 | Temp 98.9°F | Ht 71.0 in | Wt 264.0 lb

## 2021-11-20 DIAGNOSIS — J309 Allergic rhinitis, unspecified: Secondary | ICD-10-CM

## 2021-11-20 DIAGNOSIS — J4489 Other specified chronic obstructive pulmonary disease: Secondary | ICD-10-CM

## 2021-11-20 DIAGNOSIS — G4733 Obstructive sleep apnea (adult) (pediatric): Secondary | ICD-10-CM | POA: Diagnosis not present

## 2021-11-20 DIAGNOSIS — J449 Chronic obstructive pulmonary disease, unspecified: Secondary | ICD-10-CM

## 2021-11-20 DIAGNOSIS — K219 Gastro-esophageal reflux disease without esophagitis: Secondary | ICD-10-CM

## 2021-11-20 MED ORDER — FLUTICASONE PROPIONATE 50 MCG/ACT NA SUSP: 2.0000 | Freq: Every day | NASAL | 2 refills | Status: AC

## 2021-11-20 MED ORDER — LORATADINE 10 MG PO TABS
10.0000 mg | ORAL_TABLET | Freq: Every day | ORAL | 11 refills | Status: AC
Start: 2021-11-20 — End: ?

## 2021-11-20 NOTE — Assessment & Plan Note (Signed)
Good compliance and good clinical benefit.  Less daytime sleepiness, better sleep quality at night.  Plan to continue same

## 2021-11-20 NOTE — Assessment & Plan Note (Signed)
Carries a history of COPD and was actually admitted twice since December for what is described as acute exacerbations.  That said his pulmonary function testing does not show significant obstruction, has not benefited significantly from bronchodilator therapy.  Question whether this diagnosis is accurate.  He does have coexisting upper airway disease and upper airway noise that may be the active process.  He has nasal congestion that would be a contributor.  His GERD is well controlled.  He does have frequent aspiration symptoms and is being evaluated for this.  For now I will continue him on the [facility].  We will perform repeat PFT to see if his degree of obstruction is changed since 2020.  Will work to treat rhinitis more aggressively   Please continue Wixela twice a day for now.  Rinse and gargle after using.  We may decide to change this at some point going forward. Okay to keep albuterol available use either 1 nebulizer treatment or 2 puffs if needed for shortness of breath, chest tightness, wheezing, coughing. We will perform full pulmonary function testing in next office visit Work on practicing your aspiration precautions and prevention techniques. Continue omeprazole once daily.  Take this medicine 1 hour around food. Try starting fluticasone nasal spray, 2 sprays each nostril once daily. Try starting loratadine 10 mg once daily. We will perform a chest x-ray at next office visit Follow with Dr. Delton Coombes next available with full pulmonary function testing on the same day.

## 2021-11-20 NOTE — Patient Instructions (Addendum)
Please continue Wixela twice a day for now.  Rinse and gargle after using.  We may decide to change this at some point going forward. Okay to keep albuterol available use either 1 nebulizer treatment or 2 puffs if needed for shortness of breath, chest tightness, wheezing, coughing. We will perform full pulmonary function testing in next office visit Work on practicing your aspiration precautions and prevention techniques. Continue omeprazole once daily.  Take this medicine 1 hour around food. Try starting fluticasone nasal spray, 2 sprays each nostril once daily. Try starting loratadine 10 mg once daily. We will perform a chest x-ray at next office visit Follow with Dr. Delton Coombes next available with full pulmonary function testing on the same day.

## 2021-11-20 NOTE — Assessment & Plan Note (Signed)
Continue same PPI.  Appears to be well-controlled

## 2021-11-20 NOTE — Assessment & Plan Note (Signed)
Start fluticasone nasal spray Start loratadine

## 2021-11-20 NOTE — Progress Notes (Signed)
Subjective:    Patient ID: Vincent Richmond, male    DOB: 1945/11/23, 76 y.o.   MRN: [id number]  HPI  ROV 11/20/21 --76 year old man with a history of multifactorial dyspnea, hypoxemia.  He has OSA on CPAP, suspected COPD but with no significant obstruction on PFT from 03/2019.  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 PMH: Obesity, diabetes, hypertension. He has been admitted and treated twice since December, for presumed exacerbations of COPD. Today he reports that he has some aspiration episodes, happens almost every day - has been referred to speech therapy. He has hears some wheeze, not every day - sounds like there may be an UA component here. He is SOB bending over. Can get winded when walking uphill.  On omeprazole with good GERD control. Lots of nasal congestion. Coughs most of the day, some clear mucous.   Currently managed on wixela (had been off). He has albuterol nebs since the hospital. He is unsure whether either have helped him.  He wears his CPAP every night. Good clinical benefit - better sleep quality, less daytime sleepiness.   Echocardiogram 10/08/2021 with normal LV function 65-70%, mild concentric left ventricular hypertrophy, unclear presence of diastolic dysfunction.  RV size mildly enlarged, function normal.  No tricuspid jet to assess PASP.  CT chest 10/08/2021 reviewed by me, shows scattered bilateral groundglass opacities, mild bronchiectasis   Review of Systems  Constitutional:  Negative for activity change, appetite change, chills, diaphoresis, fatigue, fever and unexpected weight change.  HENT:  Positive for postnasal drip, sore throat and trouble swallowing. Negative for congestion, dental problem, nosebleeds, rhinorrhea, sinus pressure, sneezing and voice change.   Eyes:  Negative for itching and visual disturbance.  Respiratory:  Positive for cough, shortness of breath and wheezing. Negative for choking, chest tightness and  stridor.   Cardiovascular:  Positive for chest pain and palpitations. Negative for leg swelling.  Gastrointestinal:  Negative for abdominal pain.  Musculoskeletal:  Negative for joint swelling and myalgias.  Skin:  Negative for rash.  Neurological:  Positive for headaches. Negative for syncope and light-headedness.  Psychiatric/Behavioral:  Positive for agitation and dysphoric mood. Negative for sleep disturbance.         Objective:   Physical Exam Vitals:   11/20/21 1433  BP: 128/80  Pulse: 74  Temp: 98.9 F (37.2 C)  TempSrc: Oral  SpO2: 95%  Weight: 264 lb (119.7 kg)  Height: 5\' 11"  (1.803 m)   Gen: Pleasant, obese man, in no distress,  normal affect  ENT: No lesions,  mouth clear,  oropharynx clear, no postnasal drip  Neck: No JVD, no stridor  Lungs: No use of accessory muscles, no crackles or wheezing on normal respiration, no wheeze on forced expiration  Cardiovascular: RRR, heart sounds normal, no murmur or gallops, no peripheral edema  Musculoskeletal: No deformities, no cyanosis or clubbing  Neuro: alert, awake, non focal  Skin: Warm, no lesions or rash      Assessment & Plan:  OSA (obstructive sleep apnea) Good compliance and good clinical benefit.  Less daytime sleepiness, better sleep quality at night.  Plan to continue same  COPD (chronic obstructive pulmonary disease) ([place]) Carries a history of COPD and was actually admitted twice since December for what is described as acute exacerbations.  That said his pulmonary function testing does not show significant obstruction, has not benefited significantly from bronchodilator therapy.  Question whether this diagnosis is accurate.  He does have coexisting upper airway disease and  upper airway noise that may be the active process.  He has nasal congestion that would be a contributor.  His GERD is well controlled.  He does have frequent aspiration symptoms and is being evaluated for this.  For now I will continue  him on the [facility].  We will perform repeat PFT to see if his degree of obstruction is changed since 2020.  Will work to treat rhinitis more aggressively   Please continue Wixela twice a day for now.  Rinse and gargle after using.  We may decide to change this at some point going forward. Okay to keep albuterol available use either 1 nebulizer treatment or 2 puffs if needed for shortness of breath, chest tightness, wheezing, coughing. We will perform full pulmonary function testing in next office visit Work on practicing your aspiration precautions and prevention techniques. Continue omeprazole once daily.  Take this medicine 1 hour around food. Try starting fluticasone nasal spray, 2 sprays each nostril once daily. Try starting loratadine 10 mg once daily. We will perform a chest x-ray at next office visit Follow with Dr. Lamonte Sakai next available with full pulmonary function testing on the same day.   Allergic rhinitis Start fluticasone nasal spray Start loratadine  GERD (gastroesophageal reflux disease) Continue same PPI.  Appears to be well-controlled  Time spent 41 minutes  Baltazar Apo, MD, PhD 11/20/2021, 5:06 PM [facility] [phone] or if no answer [phone]

## 2022-01-03 ENCOUNTER — Ambulatory Visit: Payer: No Typology Code available for payment source | Admitting: Emergency Medicine

## 2022-01-24 ENCOUNTER — Other Ambulatory Visit: Payer: Self-pay

## 2022-01-24 ENCOUNTER — Emergency Department (HOSPITAL_COMMUNITY): Payer: No Typology Code available for payment source

## 2022-01-24 ENCOUNTER — Observation Stay (HOSPITAL_COMMUNITY)
Admission: EM | Admit: 2022-01-24 | Discharge: 2022-01-25 | Disposition: A | Discharge status: Home / Self Care | Payer: No Typology Code available for payment source | Attending: Emergency Medicine | Admitting: Emergency Medicine

## 2022-01-24 ENCOUNTER — Encounter (HOSPITAL_COMMUNITY): Payer: Self-pay

## 2022-01-24 DIAGNOSIS — Z87891 Personal history of nicotine dependence: Secondary | ICD-10-CM | POA: Insufficient documentation

## 2022-01-24 DIAGNOSIS — I10 Essential (primary) hypertension: Secondary | ICD-10-CM | POA: Diagnosis not present

## 2022-01-24 DIAGNOSIS — R0602 Shortness of breath: Secondary | ICD-10-CM | POA: Diagnosis present

## 2022-01-24 DIAGNOSIS — G4733 Obstructive sleep apnea (adult) (pediatric): Secondary | ICD-10-CM

## 2022-01-24 DIAGNOSIS — Z794 Long term (current) use of insulin: Secondary | ICD-10-CM | POA: Diagnosis not present

## 2022-01-24 DIAGNOSIS — Z8673 Personal history of transient ischemic attack (TIA), and cerebral infarction without residual deficits: Secondary | ICD-10-CM | POA: Insufficient documentation

## 2022-01-24 DIAGNOSIS — E119 Type 2 diabetes mellitus without complications: Secondary | ICD-10-CM | POA: Diagnosis not present

## 2022-01-24 DIAGNOSIS — Z7984 Long term (current) use of oral hypoglycemic drugs: Secondary | ICD-10-CM | POA: Diagnosis not present

## 2022-01-24 DIAGNOSIS — Z20822 Contact with and (suspected) exposure to covid-19: Secondary | ICD-10-CM | POA: Insufficient documentation

## 2022-01-24 DIAGNOSIS — Z79899 Other long term (current) drug therapy: Secondary | ICD-10-CM | POA: Insufficient documentation

## 2022-01-24 DIAGNOSIS — Z7982 Long term (current) use of aspirin: Secondary | ICD-10-CM | POA: Insufficient documentation

## 2022-01-24 DIAGNOSIS — J441 Chronic obstructive pulmonary disease with (acute) exacerbation: Principal | ICD-10-CM | POA: Diagnosis present

## 2022-01-24 LAB — CBC
HCT: 38.7 % — ABNORMAL LOW (ref 39.0–52.0)
Hemoglobin: 13.2 g/dL (ref 13.0–17.0)
MCH: 29.6 pg (ref 26.0–34.0)
MCHC: 34.1 g/dL (ref 30.0–36.0)
MCV: 86.8 fL (ref 80.0–100.0)
Platelets: 275 10*3/uL (ref 150–400)
RBC: 4.46 MIL/uL (ref 4.22–5.81)
RDW: 13.1 % (ref 11.5–15.5)
WBC: 10.8 10*3/uL — ABNORMAL HIGH (ref 4.0–10.5)
nRBC: 0 % (ref 0.0–0.2)

## 2022-01-24 LAB — RESP PANEL BY RT-PCR (FLU A&B, COVID) ARPGX2
Influenza A by PCR: NEGATIVE
Influenza B by PCR: NEGATIVE
SARS Coronavirus 2 by RT PCR: NEGATIVE

## 2022-01-24 LAB — BASIC METABOLIC PANEL
Anion gap: 10 (ref 5–15)
BUN: 9 mg/dL (ref 8–23)
CO2: 24 mmol/L (ref 22–32)
Calcium: 8.8 mg/dL — ABNORMAL LOW (ref 8.9–10.3)
Chloride: 99 mmol/L (ref 98–111)
Creatinine, Ser: 0.82 mg/dL (ref 0.61–1.24)
GFR, Estimated: >60 mL/min (ref >60)
Glucose, Bld: 209 mg/dL — ABNORMAL HIGH (ref 70–99)
Potassium: 4.3 mmol/L (ref 3.5–5.1)
Sodium: 133 mmol/L — ABNORMAL LOW (ref 135–145)

## 2022-01-24 LAB — BRAIN NATRIURETIC PEPTIDE: B Natriuretic Peptide: 40 pg/mL (ref 0.0–100.0)

## 2022-01-24 MED ORDER — ALBUTEROL SULFATE (2.5 MG/3ML) 0.083% IN NEBU
5.0000 mg | INHALATION_SOLUTION | Freq: Once | RESPIRATORY_TRACT | Status: AC
  Administered 2022-01-24: 5 mg via RESPIRATORY_TRACT
  Filled 2022-01-24: qty 6

## 2022-01-24 MED ORDER — FUROSEMIDE 10 MG/ML IJ SOLN
20.0000 mg | Freq: Once | INTRAMUSCULAR | Status: AC
  Administered 2022-01-24: 20 mg via INTRAVENOUS
  Filled 2022-01-24: qty 2

## 2022-01-24 MED ORDER — SODIUM CHLORIDE 0.9 % IV SOLN
500.0000 mg | Freq: Once | INTRAVENOUS | Status: AC
  Administered 2022-01-24: 500 mg via INTRAVENOUS
  Filled 2022-01-24: qty 5

## 2022-01-24 MED ORDER — SODIUM CHLORIDE 0.9 % IV SOLN
2.0000 g | Freq: Once | INTRAVENOUS | Status: AC
  Administered 2022-01-24: 2 g via INTRAVENOUS
  Filled 2022-01-24: qty 20

## 2022-01-24 MED ORDER — IPRATROPIUM-ALBUTEROL 0.5-2.5 (3) MG/3ML IN SOLN
3.0000 mL | Freq: Once | RESPIRATORY_TRACT | Status: AC
  Administered 2022-01-24: 3 mL via RESPIRATORY_TRACT
  Filled 2022-01-24: qty 3

## 2022-01-24 MED ORDER — MAGNESIUM SULFATE 2 GM/50ML IV SOLN
2.0000 g | Freq: Once | INTRAVENOUS | Status: AC
  Administered 2022-01-24: 2 g via INTRAVENOUS
  Filled 2022-01-24: qty 50

## 2022-01-24 MED ORDER — METHYLPREDNISOLONE SODIUM SUCC 125 MG IJ SOLR
125.0000 mg | INTRAMUSCULAR | Status: AC
Start: 2022-01-24 — End: 2022-01-24
  Administered 2022-01-24: 125 mg via INTRAVENOUS
  Filled 2022-01-24: qty 2

## 2022-01-24 MED ORDER — ALBUTEROL SULFATE (2.5 MG/3ML) 0.083% IN NEBU
2.5000 mg | INHALATION_SOLUTION | Freq: Once | RESPIRATORY_TRACT | Status: AC
  Administered 2022-01-24: 2.5 mg via RESPIRATORY_TRACT
  Filled 2022-01-24: qty 3

## 2022-01-24 NOTE — ED Provider Notes (Addendum)
?[facility] EMERGENCY DEPARTMENT ?Provider Note ? ? ?CSN: [id number] ?Arrival date & time: 01/24/22  1939 ? ?  ? ?History ? ?Chief Complaint  ?Patient presents with  ? Shortness of Breath  ? Cough  ? ? ?Vincent Richmond is a 76 y.o. male. ? ? ?Shortness of Breath ?Associated symptoms: cough   ?Cough ?Associated symptoms: shortness of breath   ? ?Patient is a 76 year old male with a past medical history significant for COPD, DM2, reflux, obstructive sleep apnea, asthma/COPD overlap  ? ?Presents emergency room today with complaint of shortness of breath.  Seems that his symptoms came on 1 week ago but worsened yesterday evening and progressively worsened until he presented emergency room today.  He states he has a history of similar episodes. ? ?He denies any chest pain nausea vomiting hemoptysis lightheadedness or dizziness. ?  ? ?Home Medications ?Prior to Admission medications   ?Medication Sig Start Date End Date Taking? Authorizing Provider  ?albuterol (VENTOLIN HFA) 108 (90 Base) MCG/ACT inhaler Inhale 2 puffs into the lungs every 6 (six) hours as needed for wheezing or shortness of breath. Use this 3-4 times a day for next 3days then use as needed 10/13/21   Zannie Cove, MD  ?ALPHA LIPOIC ACID PO Take 1 tablet by mouth daily.    [provider]  ?Ascorbic Acid (VITAMIN C PO) Take 1,000 mg by mouth daily.    [provider]  ?aspirin EC 325 MG tablet Take 325 mg by mouth daily.    [provider]  ?buPROPion (WELLBUTRIN XL) 300 MG 24 hr tablet Take 300 mg by mouth daily.    [provider]  ?cholecalciferol (VITAMIN D) 1000 UNITS tablet Take 1,000 Units by mouth daily.    [provider]  ?FLUoxetine (PROZAC) 20 MG capsule Take 80 mg by mouth daily. For depression and anxiety -4 tabs in am    [provider]  ?fluticasone (FLONASE) 50 MCG/ACT nasal spray Place 2 sprays into both nostrils daily. 11/20/21   Leslye Peer, MD   ?fluticasone-salmeterol [facility] INHUB) 100-50 MCG/ACT AEPB Inhale 1 puff into the lungs 2 (two) times daily. 10/13/21   Zannie Cove, MD  ?insulin glargine-yfgn (SEMGLEE) 100 UNIT/ML Pen Inject 50 Units into the skin at bedtime. 03/05/21   [provider]  ?loperamide (IMODIUM) 2 MG capsule Take 4 mg by mouth as needed for diarrhea or loose stools.    [provider]  ?loratadine (CLARITIN) 10 MG tablet Take 1 tablet (10 mg total) by mouth daily. 11/20/21   Leslye Peer, MD  ?metFORMIN (GLUCOPHAGE) 1000 MG tablet Take 1,000 mg by mouth 2 (two) times daily with a meal.    [provider]  ?metoprolol tartrate (LOPRESSOR) 25 MG tablet Take 1 tablet (25 mg total) by mouth 2 (two) times daily. 11/01/18   Ghimire, Werner Lean, MD  ?modafinil (PROVIGIL) 100 MG tablet Take 100 mg by mouth 3 (three) times daily.    [provider]  ?Multiple Vitamins-Minerals (CENTRUM SILVER ADULT 50+ PO) Take 1 tablet by mouth daily.    [provider]  ?NON FORMULARY CPAP Machine daily    [provider]  ?Omega-3 Fatty Acids (FISH OIL) 1000 MG CAPS Take 1,000 mg by mouth daily.     [provider]  ?omeprazole (PRILOSEC) 20 MG capsule Take 20 mg by mouth daily.    [provider]  ?Oxcarbazepine (TRILEPTAL) 300 MG tablet Take 450 mg by mouth 2 (two) times daily.  [provider]  ?pramipexole (MIRAPEX) 0.5 MG tablet Take 1 tablet (0.5 mg total) by mouth at bedtime. 09/18/21   Lanae Boast, MD  ?prazosin (MINIPRESS) 5 MG capsule Take 15 mg by mouth at bedtime. For nightmares and flashbacks    [provider]  ?predniSONE (DELTASONE) 20 MG tablet Take 1-2 tablets (20-40 mg total) by mouth daily with breakfast. Take 40mg  daily for 2days then 20mg  daily for 2days then STOP 10/14/21   , MD  ?pregabalin (LYRICA) 300 MG capsule Take 300 mg by mouth 2 (two) times daily.    [provider]  ?rosuvastatin (CRESTOR) 20 MG tablet Take 10  mg by mouth 3 (three) times a week. Monday, Wednesday, Friday 12/04/20   [provider]  ?Semaglutide (OZEMPIC, 0.25 OR 0.5 MG/DOSE, [place]) Inject 0.5 mg into the skin once a week.    [provider]  ?   ? ?Allergies    ?Pregabalin and Statins   ? ?Review of Systems   ?Review of Systems  ?Respiratory:  Positive for cough and shortness of breath.   ? ?Physical Exam ?Updated Vital Signs ?BP 133/75 (BP Location: Right Arm)   Pulse 75   Temp 99.2 ?F (37.3 ?C) (Oral)   Resp 18   SpO2 91%  ?Physical Exam ?Vitals and nursing note reviewed.  ?Constitutional:   ?   General: He is not in acute distress. ?   Appearance: He is obese.  ?HENT:  ?   Head: Normocephalic and atraumatic.  ?   Nose: Nose normal.  ?Eyes:  ?   General: No scleral icterus. ?Cardiovascular:  ?   Rate and Rhythm: Normal rate and regular rhythm.  ?   Pulses: Normal pulses.  ?   Heart sounds: Normal heart sounds.  ?Pulmonary:  ?   Effort: Respiratory distress present.  ?   Breath sounds: Wheezing present.  ?   Comments: Respiratory distress with tachypnea, prolonged expiratory phase, speaking in 2-3 word sentences.  Is acutely uncomfortable. ?Abdominal:  ?   Palpations: Abdomen is soft.  ?   Tenderness: There is no abdominal tenderness.  ?Musculoskeletal:  ?   Cervical back: Normal range of motion.  ?   Right lower leg: No edema.  ?   Left lower leg: No edema.  ?Skin: ?   General: Skin is warm and dry.  ?   Capillary Refill: Capillary refill takes less than 2 seconds.  ?Neurological:  ?   Mental Status: He is alert. Mental status is at baseline.  ?Psychiatric:     ?   Mood and Affect: Mood normal.     ?   Behavior: Behavior normal.  ? ? ?ED Results / Procedures / Treatments   ?Labs ?(all labs ordered are listed, but only abnormal results are displayed) ?Labs Reviewed  ?BASIC METABOLIC PANEL - Abnormal; Notable for the following components:  ?    Result Value  ? Sodium 133 (*)   ? Glucose, Bld 209 (*)   ? Calcium 8.8 (*)   ? All other  components within normal limits  ?CBC - Abnormal; Notable for the following components:  ? WBC 10.8 (*)   ? HCT 38.7 (*)   ? All other components within normal limits  ?RESP PANEL BY RT-PCR (FLU A&B, COVID) ARPGX2  ? ? ?EKG ?None ? ?Radiology ?No results found. ? ?Procedures ?ThursdayCritical Care ?Performed by: 02/03/21, PA ?Authorized by: Marland Kitchen, PA  ? ?Critical care provider statement:  ?  Critical care time (minutes):  35 ?  Critical care time was exclusive of:  Separately billable procedures and treating other patients and teaching time ?  Critical care was necessary to treat or prevent imminent or life-threatening deterioration of the following conditions: Resp distress. ?  Critical care was time spent personally by me on the following activities:  Development of treatment plan with patient or surrogate, review of old charts, re-evaluation of patient's condition, pulse oximetry, ordering and review of radiographic studies, ordering and review of laboratory studies, ordering and performing treatments and interventions, obtaining history from patient or surrogate, examination of patient and evaluation of patient's response to treatment ?  Care discussed with: admitting provider    ? ? ?Medications Ordered in ED ?Medications  ?magnesium sulfate IVPB 2 g 50 mL (2 g Intravenous New Bag/Given 01/24/22 2041)  ?ipratropium-albuterol (DUONEB) 0.5-2.5 (3) MG/3ML nebulizer solution 3 mL (3 mLs Nebulization Given 01/24/22 2040)  ?albuterol (PROVENTIL) (2.5 MG/3ML) 0.083% nebulizer solution 2.5 mg (2.5 mg Nebulization Given 01/24/22 2040)  ?methylPREDNISolone sodium succinate (SOLU-MEDROL) 125 mg/2 mL injection 125 mg (125 mg Intravenous Given 01/24/22 2040)  ?ipratropium-albuterol (DUONEB) 0.5-2.5 (3) MG/3ML nebulizer solution 3 mL (3 mLs Nebulization Given 01/24/22 2059)  ? ? ?ED Course/ Medical Decision Making/ A&P ?  ?                        ?Medical Decision Making ?Amount and/or Complexity of Data  Reviewed ?Labs: ordered. ?Radiology: ordered. ? ?Risk ?Prescription drug management. ? ? ?This patient presents to the ED for concern of SOB, this involves a number of treatment options, and is a complaint that carries with it a high risk of

## 2022-01-25 ENCOUNTER — Encounter (HOSPITAL_COMMUNITY): Payer: Self-pay | Admitting: Family Medicine

## 2022-01-25 DIAGNOSIS — J441 Chronic obstructive pulmonary disease with (acute) exacerbation: Secondary | ICD-10-CM | POA: Diagnosis not present

## 2022-01-25 DIAGNOSIS — G4733 Obstructive sleep apnea (adult) (pediatric): Secondary | ICD-10-CM | POA: Diagnosis not present

## 2022-01-25 LAB — BASIC METABOLIC PANEL
Anion gap: 13 (ref 5–15)
Anion gap: 12 (ref 5–15)
BUN: 12 mg/dL (ref 8–23)
BUN: 12 mg/dL (ref 8–23)
CO2: 23 mmol/L (ref 22–32)
CO2: 26 mmol/L (ref 22–32)
Calcium: 8.8 mg/dL — ABNORMAL LOW (ref 8.9–10.3)
Calcium: 8.9 mg/dL (ref 8.9–10.3)
Chloride: 97 mmol/L — ABNORMAL LOW (ref 98–111)
Chloride: 96 mmol/L — ABNORMAL LOW (ref 98–111)
Creatinine, Ser: 1.12 mg/dL (ref 0.61–1.24)
Creatinine, Ser: 1 mg/dL (ref 0.61–1.24)
GFR, Estimated: >60 mL/min (ref >60)
GFR, Estimated: >60 mL/min (ref >60)
Glucose, Bld: 311 mg/dL — ABNORMAL HIGH (ref 70–99)
Glucose, Bld: 257 mg/dL — ABNORMAL HIGH (ref 70–99)
Potassium: 5.3 mmol/L — ABNORMAL HIGH (ref 3.5–5.1)
Potassium: 5 mmol/L (ref 3.5–5.1)
Sodium: 133 mmol/L — ABNORMAL LOW (ref 135–145)
Sodium: 134 mmol/L — ABNORMAL LOW (ref 135–145)

## 2022-01-25 LAB — CBC
HCT: 38.9 % — ABNORMAL LOW (ref 39.0–52.0)
Hemoglobin: 13.5 g/dL (ref 13.0–17.0)
MCH: 29.9 pg (ref 26.0–34.0)
MCHC: 34.7 g/dL (ref 30.0–36.0)
MCV: 86.1 fL (ref 80.0–100.0)
Platelets: 291 10*3/uL (ref 150–400)
RBC: 4.52 MIL/uL (ref 4.22–5.81)
RDW: 13.2 % (ref 11.5–15.5)
WBC: 11.9 10*3/uL — ABNORMAL HIGH (ref 4.0–10.5)
nRBC: 0 % (ref 0.0–0.2)

## 2022-01-25 LAB — CBG MONITORING, ED
Glucose-Capillary: 319 mg/dL — ABNORMAL HIGH (ref 70–99)
Glucose-Capillary: 273 mg/dL — ABNORMAL HIGH (ref 70–99)
Glucose-Capillary: 264 mg/dL — ABNORMAL HIGH (ref 70–99)

## 2022-01-25 MED ORDER — PREDNISONE 20 MG PO TABS: 40.0000 mg | ORAL_TABLET | Freq: Every day | ORAL | 0 refills | Status: AC

## 2022-01-25 MED ORDER — SENNOSIDES-DOCUSATE SODIUM 8.6-50 MG PO TABS: 1.0000 | ORAL_TABLET | Freq: Every evening | ORAL | Status: DC | PRN

## 2022-01-25 MED ORDER — ONDANSETRON HCL 4 MG/2ML IJ SOLN
4.0000 mg | Freq: Four times a day (QID) | INTRAMUSCULAR | Status: DC | PRN
Start: 2022-01-25 — End: 2022-01-25

## 2022-01-25 MED ORDER — ACETAMINOPHEN 650 MG RE SUPP: 650.0000 mg | Freq: Four times a day (QID) | RECTAL | Status: DC | PRN

## 2022-01-25 MED ORDER — INSULIN ASPART 100 UNIT/ML IJ SOLN
0.0000 [IU] | Freq: Three times a day (TID) | INTRAMUSCULAR | Status: DC
  Administered 2022-01-25 (×2): 8 [IU] via SUBCUTANEOUS

## 2022-01-25 MED ORDER — SODIUM CHLORIDE 0.9 % IV SOLN: 1.0000 g | INTRAVENOUS | Status: DC

## 2022-01-25 MED ORDER — ALBUTEROL SULFATE (2.5 MG/3ML) 0.083% IN NEBU: 2.5000 mg | INHALATION_SOLUTION | RESPIRATORY_TRACT | Status: DC | PRN

## 2022-01-25 MED ORDER — IPRATROPIUM-ALBUTEROL 0.5-2.5 (3) MG/3ML IN SOLN
3.0000 mL | Freq: Four times a day (QID) | RESPIRATORY_TRACT | Status: DC
  Administered 2022-01-25 (×2): 3 mL via RESPIRATORY_TRACT
  Filled 2022-01-25 (×2): qty 3

## 2022-01-25 MED ORDER — FUROSEMIDE 20 MG PO TABS: 20.0000 mg | ORAL_TABLET | Freq: Every day | ORAL | 0 refills | Status: AC

## 2022-01-25 MED ORDER — METHYLPREDNISOLONE SODIUM SUCC 125 MG IJ SOLR
125.0000 mg | Freq: Two times a day (BID) | INTRAMUSCULAR | Status: DC
  Administered 2022-01-25: 125 mg via INTRAVENOUS
  Filled 2022-01-25: qty 2

## 2022-01-25 MED ORDER — CEFDINIR 300 MG PO CAPS: 300.0000 mg | ORAL_CAPSULE | Freq: Two times a day (BID) | ORAL | 0 refills | Status: AC

## 2022-01-25 MED ORDER — INSULIN GLARGINE-YFGN 100 UNIT/ML ~~LOC~~ SOLN
40.0000 [IU] | Freq: Every day | SUBCUTANEOUS | Status: DC
  Administered 2022-01-25: 40 [IU] via SUBCUTANEOUS
  Filled 2022-01-25 (×2): qty 0.4

## 2022-01-25 MED ORDER — PANTOPRAZOLE SODIUM 40 MG PO TBEC: 40.0000 mg | DELAYED_RELEASE_TABLET | Freq: Every day | ORAL | 1 refills | Status: AC

## 2022-01-25 MED ORDER — INSULIN ASPART 100 UNIT/ML IJ SOLN
0.0000 [IU] | Freq: Every day | INTRAMUSCULAR | Status: DC
  Administered 2022-01-25: 4 [IU] via SUBCUTANEOUS

## 2022-01-25 MED ORDER — ONDANSETRON HCL 4 MG PO TABS: 4.0000 mg | ORAL_TABLET | Freq: Four times a day (QID) | ORAL | Status: DC | PRN

## 2022-01-25 MED ORDER — PREDNISONE 20 MG PO TABS: 40.0000 mg | ORAL_TABLET | Freq: Every day | ORAL | Status: DC

## 2022-01-25 MED ORDER — AZITHROMYCIN 500 MG PO TABS
500.0000 mg | ORAL_TABLET | Freq: Every day | ORAL | 0 refills | Status: AC
Start: 2022-01-25 — End: 2022-01-30

## 2022-01-25 MED ORDER — ACETAMINOPHEN 325 MG PO TABS: 650.0000 mg | ORAL_TABLET | Freq: Four times a day (QID) | ORAL | Status: DC | PRN

## 2022-01-25 MED ORDER — ENOXAPARIN SODIUM 40 MG/0.4ML IJ SOSY
40.0000 mg | PREFILLED_SYRINGE | Freq: Every day | INTRAMUSCULAR | Status: DC
  Administered 2022-01-25: 40 mg via SUBCUTANEOUS
  Filled 2022-01-25: qty 0.4

## 2022-01-25 NOTE — H&P (Signed)
?History and Physical  ? ? ?Vincent Richmond VQX:[id number] DOB: [birth year] DOA: 01/24/2022 ? ?PCP: [facility]  ? ?Patient coming from: Home  ? ?Chief Complaint: SOB, cough  ? ?HPI: Vincent Richmond is a pleasant 76 y.o. male with medical history significant for type 2 diabetes mellitus, PTSD, history of stroke, hypertension, and multiple admissions with SOB, cough, and wheezing suspected to be COPD exacerbations, now presenting to the emergency department with a week of progressive dyspnea, productive cough, and wheezing.  He was initially finding some relief with breathing treatments at home but began to worsen despite frequent treatments and felt dyspneic even at rest today.  He denies fevers, chills, chest pain, orthopnea, or leg swelling.  He had some mild rhinorrhea and scratchy throat little over a week ago which seemed to resolve after 1 or 2 days. ? ?ED Course: Upon arrival to the ED, patient is found to be afebrile and saturating mid 90s on room air with stable blood pressure.  EKG features sinus rhythm with LAD and chest x-ray is negative for acute cardiopulmonary disease.  Chemistry panel notable for glucose 209.  CBC with WBC 10,800.  Patient was treated with 125 mg IV Solu-Medrol, 2 g IV magnesium, 20 mg IV Lasix, 2 albuterol nebs, 2 duo nebs, Rocephin, and azithromycin in the ED. ? ?Review of Systems:  ?All other systems reviewed and apart from HPI, are negative. ? ?Past Medical History:  ?Diagnosis Date  ? Diabetes mellitus without complication (HCC)   ? Hypertension   ? PTSD (post-traumatic stress disorder)   ? Stroke [facility])   ? ? ?Past Surgical History:  ?Procedure Laterality Date  ? TONSILLECTOMY    ? As A child  ? ? ?Social History:  ? reports that he quit smoking about 46 years ago. His smoking use included cigarettes. He started smoking about 67 years ago. He has a 21.00 pack-year smoking history. He has never used smokeless tobacco. He reports that he does not drink alcohol and does not  use drugs. ? ?Allergies  ?Allergen Reactions  ? Pregabalin Diarrhea  ?  Pt takes this at home daily  ? Statins Other (See Comments)  ?  Severe muscle pains  ? ? ?Family History  ?Problem Relation Age of Onset  ? Heart disease Father   ? Cancer Father   ? ? ? ?Prior to Admission medications   ?Medication Sig Start Date End Date Taking? Authorizing Provider  ?albuterol (VENTOLIN HFA) 108 (90 Base) MCG/ACT inhaler Inhale 2 puffs into the lungs every 6 (six) hours as needed for wheezing or shortness of breath. Use this 3-4 times a day for next 3days then use as needed 10/13/21   Zannie Cove, MD  ?ALPHA LIPOIC ACID PO Take 1 tablet by mouth daily.    [provider]  ?Ascorbic Acid (VITAMIN C PO) Take 1,000 mg by mouth daily.    [provider]  ?aspirin EC 325 MG tablet Take 325 mg by mouth daily.    [provider]  ?buPROPion (WELLBUTRIN XL) 300 MG 24 hr tablet Take 300 mg by mouth daily.    [provider]  ?cholecalciferol (VITAMIN D) 1000 UNITS tablet Take 1,000 Units by mouth daily.    [provider]  ?FLUoxetine (PROZAC) 20 MG capsule Take 80 mg by mouth daily. For depression and anxiety -4 tabs in am    [provider]  ?fluticasone (FLONASE) 50 MCG/ACT nasal spray Place 2 sprays into both nostrils daily. 11/20/21  Leslye PeerByrum, Robert S, MD  ?fluticasone-salmeterol [facility](WIXELA INHUB) 100-50 MCG/ACT AEPB Inhale 1 puff into the lungs 2 (two) times daily. 10/13/21   Zannie CoveJoseph, Preetha, MD  ?insulin glargine-yfgn (SEMGLEE) 100 UNIT/ML Pen Inject 50 Units into the skin at bedtime. 03/05/21   [provider]  ?loperamide (IMODIUM) 2 MG capsule Take 4 mg by mouth as needed for diarrhea or loose stools.    [provider]  ?loratadine (CLARITIN) 10 MG tablet Take 1 tablet (10 mg total) by mouth daily. 11/20/21   Leslye PeerByrum, Robert S, MD  ?metFORMIN (GLUCOPHAGE) 1000 MG tablet Take 1,000 mg by mouth 2 (two) times daily with a meal.    [provider]   ?metoprolol tartrate (LOPRESSOR) 25 MG tablet Take 1 tablet (25 mg total) by mouth 2 (two) times daily. 11/01/18   Ghimire, Werner LeanShanker M, MD  ?modafinil (PROVIGIL) 100 MG tablet Take 100 mg by mouth 3 (three) times daily.    [provider]  ?Multiple Vitamins-Minerals (CENTRUM SILVER ADULT 50+ PO) Take 1 tablet by mouth daily.    [provider]  ?NON FORMULARY CPAP Machine daily    [provider]  ?Omega-3 Fatty Acids (FISH OIL) 1000 MG CAPS Take 1,000 mg by mouth daily.     [provider]  ?omeprazole (PRILOSEC) 20 MG capsule Take 20 mg by mouth daily.    [provider]  ?Oxcarbazepine (TRILEPTAL) 300 MG tablet Take 450 mg by mouth 2 (two) times daily.    [provider]  ?pramipexole (MIRAPEX) 0.5 MG tablet Take 1 tablet (0.5 mg total) by mouth at bedtime. 09/18/21   Lanae BoastKc, Ramesh, MD  ?prazosin (MINIPRESS) 5 MG capsule Take 15 mg by mouth at bedtime. For nightmares and flashbacks    [provider]  ?predniSONE (DELTASONE) 20 MG tablet Take 1-2 tablets (20-40 mg total) by mouth daily with breakfast. Take 40mg  daily for 2days then 20mg  daily for 2days then STOP 10/14/21   Zannie CoveJoseph, Preetha, MD  ?pregabalin (LYRICA) 300 MG capsule Take 300 mg by mouth 2 (two) times daily.    [provider]  ?rosuvastatin (CRESTOR) 20 MG tablet Take 10 mg by mouth 3 (three) times a week. Monday, Wednesday, Friday 12/04/20   [provider]  ?Semaglutide (OZEMPIC, 0.25 OR 0.5 MG/DOSE, [place]) Inject 0.5 mg into the skin once a week.    [provider]  ? ? ?Physical Exam: ?Vitals:  ? 01/24/22 2330 01/24/22 2345 01/25/22 0000 01/25/22 0015  ?BP: 127/61 (!) 134/57 (!) 130/55 (!) 133/53  ?Pulse: 81 84 84 84  ?Resp: 18 17 16 17   ?Temp:      ?TempSrc:      ?SpO2: 91% 93% 94% 91%  ?Weight:      ?Height:      ? ? ?Constitutional: Awake, calm  ?Eyes: PERTLA, lids and conjunctivae normal ?ENMT: Mucous membranes are moist. Posterior pharynx clear of any exudate or  lesions.   ?Neck: supple, no masses  ?Respiratory: Diminished breath sounds bilaterally with prolonged expiratory phase and wheezes.  ?Cardiovascular: S1 & S2 heard, regular rate and rhythm. Trace lower leg edema b/l.  ?Abdomen: No distension, no tenderness, soft. Bowel sounds active.  ?Musculoskeletal: no clubbing / cyanosis. No joint deformity upper and lower extremities.   ?Skin: no significant rashes, lesions, ulcers. Warm, dry, well-perfused. ?Neurologic: CN 2-12 grossly intact. Moving all extremities. Alert and oriented.  ?Psychiatric: Very pleasant. Cooperative.  ? ? ?Labs and Imaging on Admission: I have personally reviewed following labs and  imaging studies ? ?CBC: ?Recent Labs  ?Lab 01/24/22 ?2038  ?WBC 10.8*  ?HGB 13.2  ?HCT 38.7*  ?MCV 86.8  ?PLT 275  ? ?Basic Metabolic Panel: ?Recent Labs  ?Lab 01/24/22 ?2038  ?NA 133*  ?K 4.3  ?CL 99  ?CO2 24  ?GLUCOSE 209*  ?BUN 9  ?CREATININE 0.82  ?CALCIUM 8.8*  ? ?GFR: ?Estimated Creatinine Clearance: 100.1 mL/min (by C-G formula based on SCr of 0.82 mg/dL). ?Liver Function Tests: ?No results for input(s): AST, ALT, ALKPHOS, BILITOT, PROT, ALBUMIN in the last 168 hours. ?No results for input(s): LIPASE, AMYLASE in the last 168 hours. ?No results for input(s): AMMONIA in the last 168 hours. ?Coagulation Profile: ?No results for input(s): INR, PROTIME in the last 168 hours. ?Cardiac Enzymes: ?No results for input(s): CKTOTAL, CKMB, CKMBINDEX, TROPONINI in the last 168 hours. ?BNP (last 3 results) ?No results for input(s): PROBNP in the last 8760 hours. ?HbA1C: ?No results for input(s): HGBA1C in the last 72 hours. ?CBG: ?No results for input(s): GLUCAP in the last 168 hours. ?Lipid Profile: ?No results for input(s): CHOL, HDL, LDLCALC, TRIG, CHOLHDL, LDLDIRECT in the last 72 hours. ?Thyroid Function Tests: ?No results for input(s): TSH, T4TOTAL, FREET4, T3FREE, THYROIDAB in the last 72 hours. ?Anemia Panel: ?No results for input(s): VITAMINB12, FOLATE, FERRITIN,  TIBC, IRON, RETICCTPCT in the last 72 hours. ?Urine analysis: ?No results found for: COLORURINE, APPEARANCEUR, LABSPEC, PHURINE, GLUCOSEU, HGBUR, BILIRUBINUR, KETONESUR, PROTEINUR, UROBILINOGEN, NITRITE, LEUKOCYTE

## 2022-01-25 NOTE — Discharge Instructions (Signed)
Follow with Primary MD [facility] in 10 days  ? ?Get CBC, CMP, 2 view Chest X ray checked  by Primary MD next visit.  ? ? ?Activity: As tolerated with Full fall precautions use walker/cane & assistance as needed ? ? ?Disposition Home  ? ? ?Diet: Heart Healthy, Carb modified ? ? ?On your next visit with your primary care physician please Get Medicines reviewed and adjusted. ? ? ?Please request your Prim.MD to go over all Hospital Tests and Procedure/Radiological results at the follow up, please get all Hospital records sent to your Prim MD by signing hospital release before you go home. ? ? ?If you experience worsening of your admission symptoms, develop shortness of breath, life threatening emergency, suicidal or homicidal thoughts you must seek medical attention immediately by calling 911 or calling your MD immediately  if symptoms less severe. ? ?You Must read complete instructions/literature along with all the possible adverse reactions/side effects for all the Medicines you take and that have been prescribed to you. Take any new Medicines after you have completely understood and accpet all the possible adverse reactions/side effects.  ? ?Do not drive, operating heavy machinery, perform activities at heights, swimming or participation in water activities or provide baby sitting services if your were admitted for syncope or siezures until you have seen by Primary MD or a Neurologist and advised to do so again. ? ?Do not drive when taking Pain medications.  ? ? ?Do not take more than prescribed Pain, Sleep and Anxiety Medications ? ?Special Instructions: If you have smoked or chewed Tobacco  in the last 2 yrs please stop smoking, stop any regular Alcohol  and or any Recreational drug use. ? ?Wear Seat belts while driving. ? ? ?Please note ? ?You were cared for by a hospitalist during your hospital stay. If you have any questions about your discharge medications or the care you received while you were  in the hospital after you are discharged, you can call the unit and asked to speak with the hospitalist on call if the hospitalist that took care of you is not available. Once you are discharged, your primary care physician will handle any further medical issues. Please note that NO REFILLS for any discharge medications will be authorized once you are discharged, as it is imperative that you return to your primary care physician (or establish a relationship with a primary care physician if you do not have one) for your aftercare needs so that they can reassess your need for medications and monitor your lab values.  ?

## 2022-01-25 NOTE — ED Notes (Signed)
Wife notified of patient's discharge disposition; ETA [id number] ?

## 2022-01-25 NOTE — ED Notes (Signed)
Breakfast order placed ?

## 2022-01-25 NOTE — Discharge Summary (Signed)
Physician Discharge Summary  ?Vincent FramesWilliam Richmond ZOX:[id number] DOB: [birth year] DOA: 01/24/2022 ? ?[facility] ? ?Admit date: 01/24/2022 ?Discharge date: 01/25/2022 ? ?Admitted From: Home ?Disposition:  Home  ? ?Recommendations for Outpatient Follow-up:  ?Follow up with PCP in 1-2 weeks ?Please keep your appointment with GI on 5/8 ?Please follow up with your pulmonary physician in couple weeks ? ? ?Discharge Condition:Stable ?CODE STATUS:FULL ?Diet recommendation: Heart Healthy / Carb Modified  ? ?Brief/Interim Summary: ? ?  ?HPI: Vincent FramesWilliam Richmond is a pleasant 76 y.o. male with medical history significant for type 2 diabetes mellitus, PTSD, history of stroke, hypertension, and multiple admissions with SOB, cough, and wheezing suspected to be COPD exacerbations, now presenting to the emergency department with a week of progressive dyspnea, productive cough, and wheezing.  He was initially finding some relief with breathing treatments at home but began to worsen despite frequent treatments and felt dyspneic even at rest today.  He denies fevers, chills, chest pain, orthopnea, or leg swelling.  He had some mild rhinorrhea and scratchy throat little over a week ago which seemed to resolve after 1 or 2 days. ?  ?ED Course: Upon arrival to the ED, patient is found to be afebrile and saturating mid 90s on room air with stable blood pressure.  EKG features sinus rhythm with LAD and chest x-ray is negative for acute cardiopulmonary disease.  Chemistry panel notable for glucose 209.  CBC with WBC 10,800.  Patient was treated with 125 mg IV Solu-Medrol, 2 g IV magnesium, 20 mg IV Lasix, 2 albuterol nebs, 2 duo nebs, Rocephin, and azithromycin in the ED. ? ? COPD with acute exacerbation  ?-Patient presents with progressive dyspnea, productive cough, wheezing and phlegm, he reports multiple episodes of bad reflux where he was felt dyspnea and wheezing after these events, so likely his episodes of bronchospasm and wheezing  provoked by microaspiration from his bad reflux, actually the wife tells me he is scheduled to have endoscopy on 02/10/2022 as [facility] was [facility], possible hiatal hernia, but [facility]), so patient will be discharged on Protonix to help with his reflux, he was [facility], he will be discharged on prednisone, antibiotics for 5 days, he will be given low-dose Lasix 20 mg oral daily for 7 days only given he had significant relief of his dyspnea with Lasix. ? ?2. Type II DM  ?- A1c was 7.8% in December 2022  ?-Continue with home regimen ?  ?3. OSA  ?- CPAP qHS   ?  ? ?Discharge Diagnoses:  ?Principal Problem: ?  COPD exacerbation (HCC) ?Active Problems: ?  Diabetes mellitus without complication (HCC) ?  HTN (hypertension) ?  OSA (obstructive sleep apnea) ? ? ? ?Discharge Instructions ? ?Discharge Instructions   ? ? Diet - low sodium heart healthy   Complete by: As directed ?  ? Discharge instructions   Complete by: As directed ?  ? [facility]  ? ?Get CBC, CMP, 2 view Chest X ray checked  by Primary MD next visit.  ? ? ?Activity: As tolerated with Full fall precautions use walker/cane & assistance as needed ? ? ?Disposition Home  ? ? ?Diet: Heart Healthy, Carb modified ? ? ?On your next visit with your primary care physician please Get Medicines reviewed and adjusted. ? ? ?Please request your Prim.MD to  go over all Hospital Tests and Procedure/Radiological results at the follow up, please get all Hospital records sent to your Prim MD by signing hospital release before you go home. ? ? ?If you experience worsening of your admission symptoms, develop shortness of breath, life threatening emergency, suicidal or homicidal thoughts you must seek medical attention immediately by calling 911 or calling your MD immediately  if symptoms less severe. ? ?You  Must read complete instructions/literature along with all the possible adverse reactions/side effects for all the Medicines you take and that have been prescribed to you. Take any new Medicines after you have completely understood and accpet all the possible adverse reactions/side effects.  ? ?Do not drive, operating heavy machinery, perform activities at heights, swimming or participation in water activities or provide baby sitting services if your were admitted for syncope or siezures until you have seen by Primary MD or a Neurologist and advised to do so again. ? ?Do not drive when taking Pain medications.  ? ? ?Do not take more than prescribed Pain, Sleep and Anxiety Medications ? ?Special Instructions: If you have smoked or chewed Tobacco  in the last 2 yrs please stop smoking, stop any regular Alcohol  and or any Recreational drug use. ? ?Wear Seat belts while driving. ? ? ?Please note ? ?You were cared for by a hospitalist during your hospital stay. If you have any questions about your discharge medications or the care you received while you were in the hospital after you are discharged, you can call the unit and asked to speak with the hospitalist on call if the hospitalist that took care of you is not available. Once you are discharged, your primary care physician will handle any further medical issues. Please note that NO REFILLS for any discharge medications will be authorized once you are discharged, as it is imperative that you return to your primary care physician (or establish a relationship with a primary care physician if you do not have one) for your aftercare needs so that they can reassess your need for medications and monitor your lab values.  ? Increase activity slowly   Complete by: As directed ?  ? ?  ? ?Allergies as of 01/25/2022   ? ?   Reactions  ? Pregabalin Diarrhea  ? Pt takes this at home daily  ? Statins Other (See Comments)  ? Severe muscle pains  ? ?  ? ?  ?Medication List  ?   ? ?STOP taking these medications   ? ?metoprolol tartrate 25 MG tablet ?Commonly known as: LOPRESSOR ?  ? ?  ? ?TAKE these medications   ? ?albuterol 108 (90 Base) MCG/ACT inhaler ?Commonly known as: VENTOLIN HFA ?Inhale 2 puffs into the lungs every 6 (six) hours as needed for wheezing or shortness of breath. Use this 3-4 times a day for next 3days then use as needed ?  ?ALPHA LIPOIC ACID PO ?Take 1 tablet by mouth daily. ?  ?aspirin EC 325 MG tablet ?Take 325 mg by mouth daily. ?  ?azithromycin 500 MG tablet ?Commonly known as: Zithromax ?Take 1 tablet (500 mg total) by mouth daily for 5 days. Take 1 tablet daily for 3 days. ?  ?buPROPion 300 MG 24 hr tablet ?Commonly known as: WELLBUTRIN XL ?Take 300 mg by mouth daily. ?  ?cefdinir 300 MG capsule ?Commonly known as: OMNICEF ?Take 1 capsule (300 mg total) by mouth 2 (two) times daily. ?  ?cholecalciferol 1000 units tablet ?Commonly known as: VITAMIN  D ?Take 1,000 Units by mouth daily. ?  ?FLUoxetine 20 MG capsule ?Commonly known as: PROZAC ?Take 80 mg by mouth daily. For depression and anxiety ?  ?fluticasone 50 MCG/ACT nasal spray ?Commonly known as: FLONASE ?Place 2 sprays into both nostrils daily. ?  ?fluticasone-salmeterol 100-50 MCG/ACT Aepb ?Commonly known as: Wixela Inhub ?Inhale 1 puff into the lungs 2 (two) times daily. ?  ?furosemide 20 MG tablet ?Commonly known as: Lasix ?Take 1 tablet (20 mg total) by mouth daily for 7 days. ?  ?glipiZIDE 10 MG tablet ?Commonly known as: GLUCOTROL ?Take 10 mg by mouth 2 (two) times daily before a meal. ?  ?loperamide 2 MG capsule ?Commonly known as: IMODIUM ?Take 2 mg by mouth as needed. ?  ?loratadine 10 MG tablet ?Commonly known as: CLARITIN ?Take 1 tablet (10 mg total) by mouth daily. ?  ?metFORMIN 1000 MG tablet ?Commonly known as: GLUCOPHAGE ?Take 1,000 mg by mouth 2 (two) times daily with a meal. ?  ?modafinil 100 MG tablet ?Commonly known as: PROVIGIL ?Take 100 mg by mouth every morning. ?  ?NON FORMULARY ?CPAP  Machine daily ?  ?OZEMPIC (0.25 OR 0.5 MG/DOSE) [place] ?Inject 1 mg into the skin once a week. ?  ?pantoprazole 40 MG tablet ?Commonly known as: Protonix ?Take 1 tablet (40 mg total) by mouth daily. ?  ?pramipexol

## 2022-01-26 ENCOUNTER — Telehealth: Payer: Self-pay | Admitting: *Deleted

## 2022-01-26 NOTE — Telephone Encounter (Signed)
Pharmacy called related to Rx: azithromycin sig .Marland KitchenMarland KitchenEDCM clarified with prescribing MD(Elgergawy) to change Rx to: take 1 tablet for 3 days.   ?

## 2022-01-29 DIAGNOSIS — I440 Atrioventricular block, first degree: Secondary | ICD-10-CM | POA: Diagnosis not present

## 2022-01-29 DIAGNOSIS — E119 Type 2 diabetes mellitus without complications: Secondary | ICD-10-CM | POA: Diagnosis not present

## 2022-01-29 DIAGNOSIS — Z20822 Contact with and (suspected) exposure to covid-19: Secondary | ICD-10-CM | POA: Diagnosis not present

## 2022-01-29 DIAGNOSIS — K449 Diaphragmatic hernia without obstruction or gangrene: Secondary | ICD-10-CM | POA: Diagnosis not present

## 2022-01-29 DIAGNOSIS — R0600 Dyspnea, unspecified: Secondary | ICD-10-CM | POA: Diagnosis not present

## 2022-01-29 DIAGNOSIS — K219 Gastro-esophageal reflux disease without esophagitis: Secondary | ICD-10-CM | POA: Diagnosis not present

## 2022-01-30 DIAGNOSIS — E1142 Type 2 diabetes mellitus with diabetic polyneuropathy: Secondary | ICD-10-CM | POA: Diagnosis not present

## 2022-01-30 DIAGNOSIS — K449 Diaphragmatic hernia without obstruction or gangrene: Secondary | ICD-10-CM | POA: Diagnosis not present

## 2022-01-30 DIAGNOSIS — Z9989 Dependence on other enabling machines and devices: Secondary | ICD-10-CM | POA: Diagnosis not present

## 2022-01-30 DIAGNOSIS — Z794 Long term (current) use of insulin: Secondary | ICD-10-CM | POA: Diagnosis not present

## 2022-01-30 DIAGNOSIS — G4733 Obstructive sleep apnea (adult) (pediatric): Secondary | ICD-10-CM | POA: Diagnosis not present

## 2022-01-30 DIAGNOSIS — R0600 Dyspnea, unspecified: Secondary | ICD-10-CM | POA: Diagnosis not present

## 2022-01-30 DIAGNOSIS — E1165 Type 2 diabetes mellitus with hyperglycemia: Secondary | ICD-10-CM | POA: Diagnosis not present

## 2022-01-30 DIAGNOSIS — K219 Gastro-esophageal reflux disease without esophagitis: Secondary | ICD-10-CM | POA: Diagnosis not present

## 2022-09-23 IMAGING — CT CT CHEST W/O CM
2 of 3 series · 15 of 36 positions shown, 18 images · non-contrast
Comparison: 10/07/2021

CLINICAL DATA: Respiratory illness, chronic shortness of breath and
wheezing.

EXAM:
CT CHEST WITHOUT CONTRAST
TECHNIQUE: Multidetector CT imaging of the chest was performed following the
standard protocol without IV contrast.

[Series 3: chest w/o 2mm st · axial · non-contrast · 0.98mm/px · z∈[+1493,+1749]mm · 12 of 152 slices shown, 15 images]
[im 12/152  mediastinal]
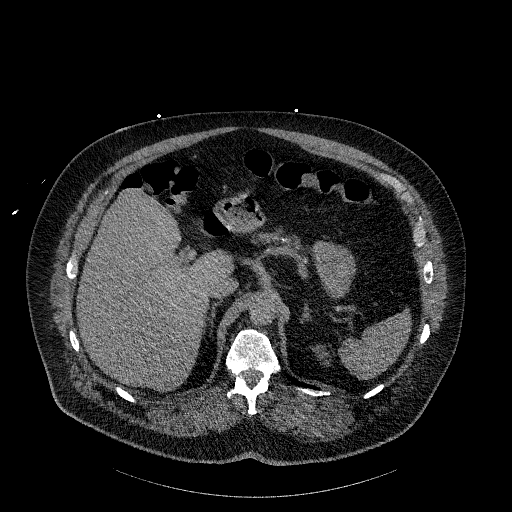
[im 12/152  lung]
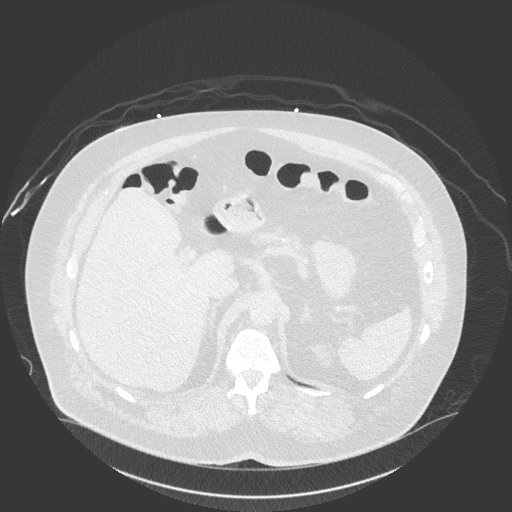
[im 23/152  lung]
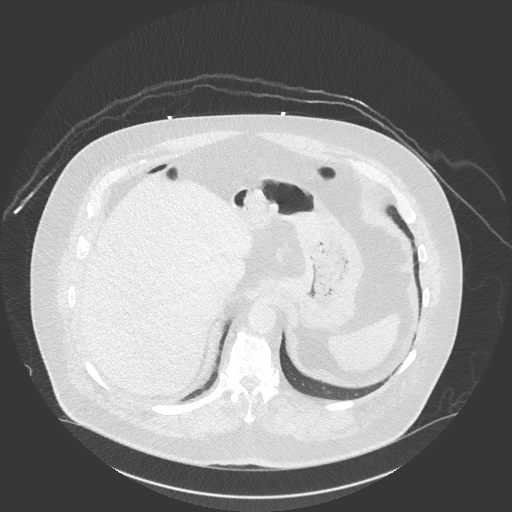
[im 34/152  lung]
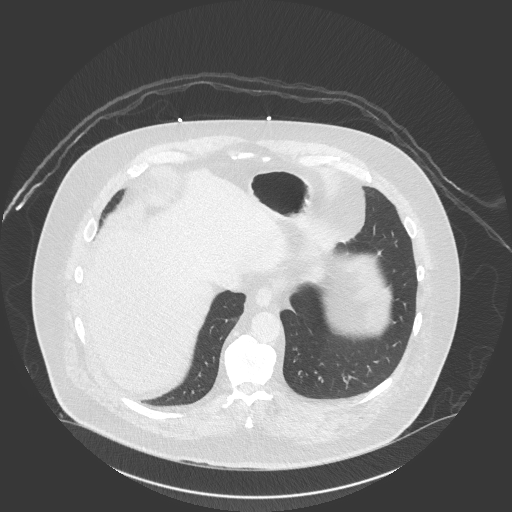
[im 45/152  lung]
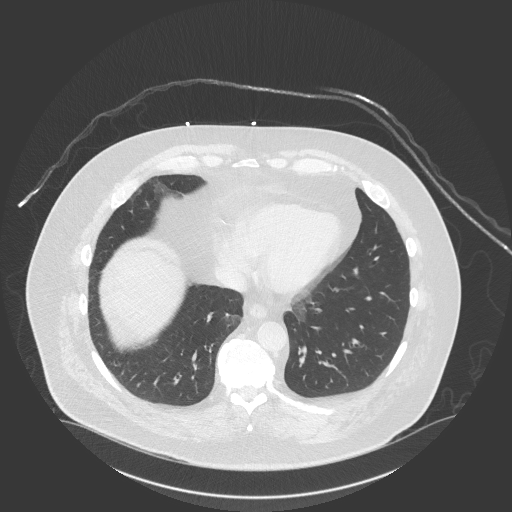
[im 56/152  mediastinal]
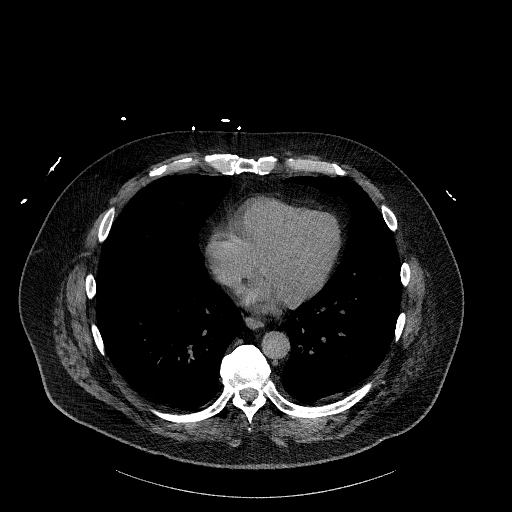
[im 56/152  lung]
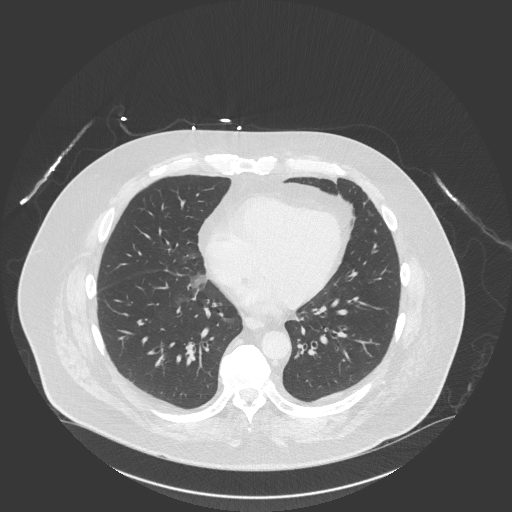
[im 68/152  lung]
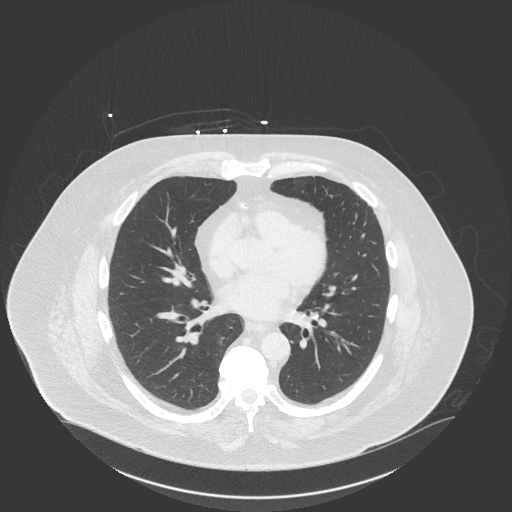
[im 84/152  lung]
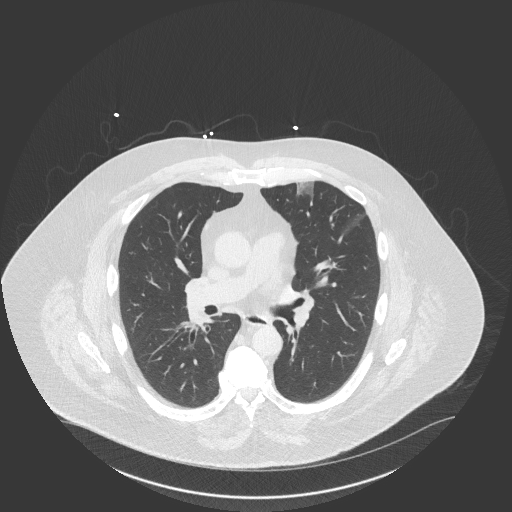
[im 96/152  lung]
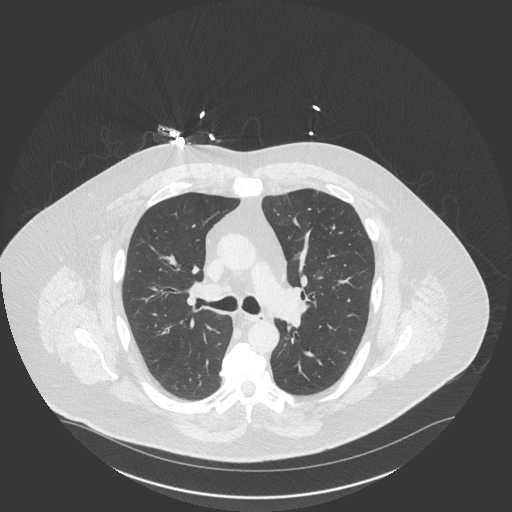
[im 107/152  mediastinal]
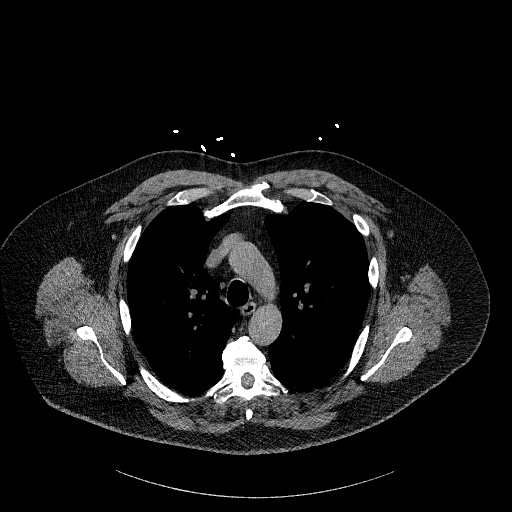
[im 107/152  lung]
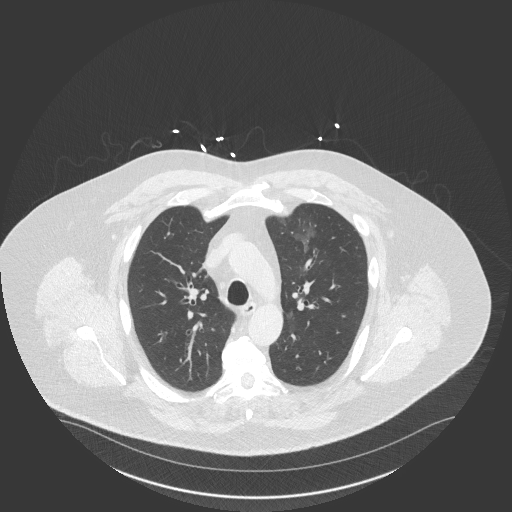
[im 118/152  lung]
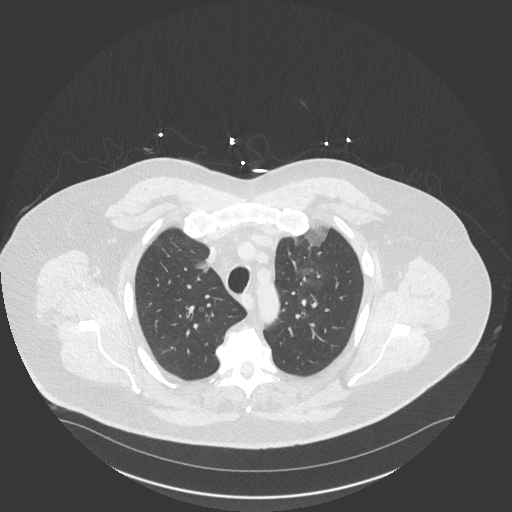
[im 129/152  lung]
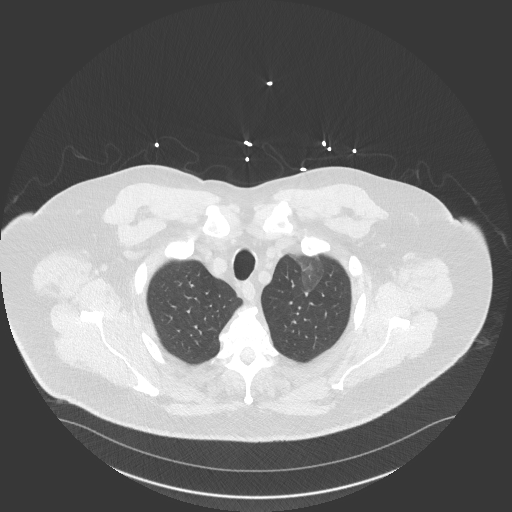
[im 140/152  lung]
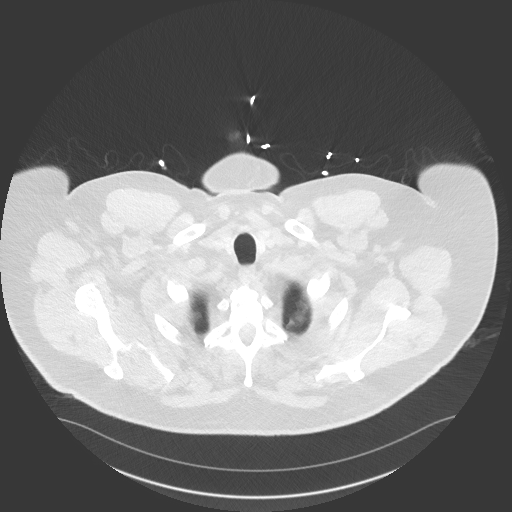

[Series 5: chest w/o 2mm st cor · coronal · non-contrast · 0.59mm/px · 3 of 172 slices shown]
[im 35/172  lung]
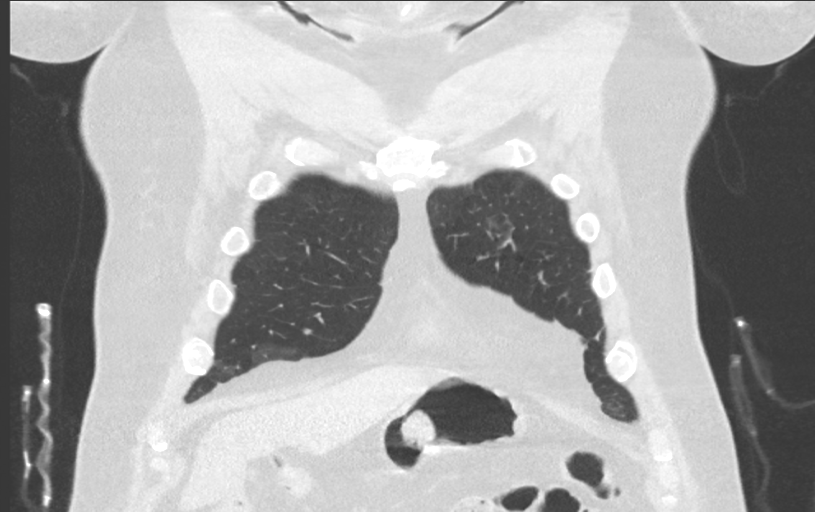
[im 69/172  lung]
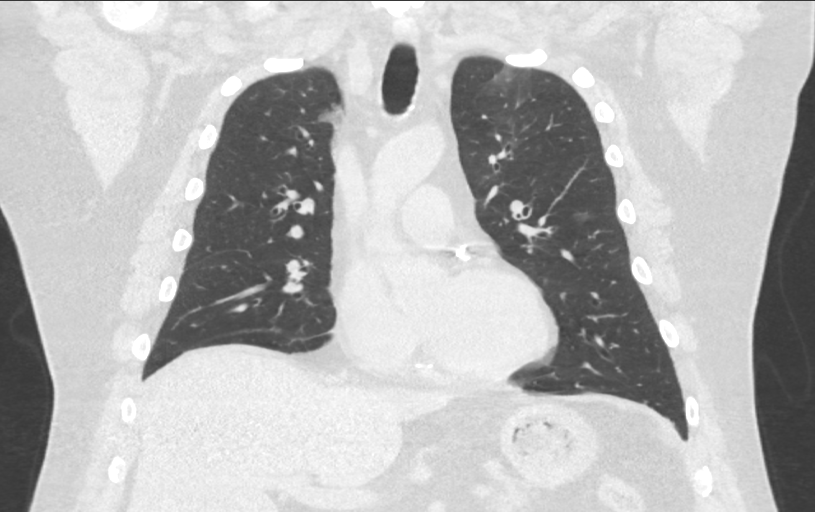
[im 103/172  lung]
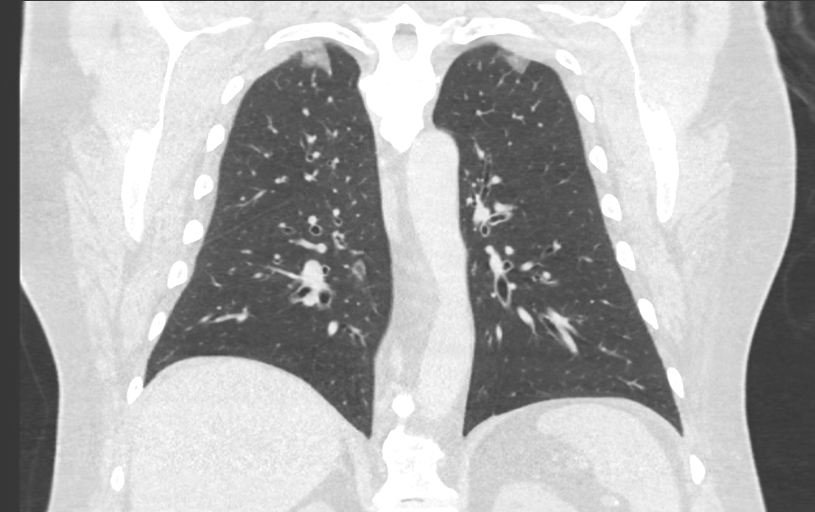

[15 of 36 positions shown; findings below may reference images not displayed]

FINDINGS: Cardiovascular: The heart is normal in size and there is no
pericardial effusion. Scattered coronary artery calcifications are
present. Mild aortic atherosclerosis without evidence of aneurysm.
The pulmonary trunk is normal in caliber.

Mediastinum/Nodes: No enlarged mediastinal or axillary lymph nodes.
Thyroid gland, trachea, and esophagus demonstrate no significant
findings.

Lungs/Pleura: Scattered ground-glass opacities are present in the
lungs bilaterally. No effusion or pneumothorax. Mild bronchiectasis
is noted.

Upper Abdomen: No acute abnormality.

Musculoskeletal: Degenerative changes are present in the thoracic
spine. No acute or suspicious osseous abnormality.
IMPRESSION: 1. Bronchiectasis with scattered ground-glass opacities in the lungs
bilaterally, possible pneumonitis.
2. Aortic atherosclerosis.
3. Coronary artery calcifications.

## 2022-11-17 DIAGNOSIS — K219 Gastro-esophageal reflux disease without esophagitis: Secondary | ICD-10-CM | POA: Diagnosis not present

## 2022-11-17 DIAGNOSIS — E785 Hyperlipidemia, unspecified: Secondary | ICD-10-CM | POA: Diagnosis not present

## 2022-11-17 DIAGNOSIS — G2581 Restless legs syndrome: Secondary | ICD-10-CM | POA: Diagnosis not present

## 2022-11-17 DIAGNOSIS — R269 Unspecified abnormalities of gait and mobility: Secondary | ICD-10-CM | POA: Diagnosis not present

## 2022-11-17 DIAGNOSIS — Z794 Long term (current) use of insulin: Secondary | ICD-10-CM | POA: Diagnosis not present

## 2022-11-17 DIAGNOSIS — F325 Major depressive disorder, single episode, in full remission: Secondary | ICD-10-CM | POA: Diagnosis not present

## 2022-11-17 DIAGNOSIS — I10 Essential (primary) hypertension: Secondary | ICD-10-CM | POA: Diagnosis not present

## 2022-11-17 DIAGNOSIS — J309 Allergic rhinitis, unspecified: Secondary | ICD-10-CM | POA: Diagnosis not present

## 2022-11-17 DIAGNOSIS — R69 Illness, unspecified: Secondary | ICD-10-CM | POA: Diagnosis not present

## 2022-11-17 DIAGNOSIS — J4489 Other specified chronic obstructive pulmonary disease: Secondary | ICD-10-CM | POA: Diagnosis not present

## 2022-11-17 DIAGNOSIS — M1990 Unspecified osteoarthritis, unspecified site: Secondary | ICD-10-CM | POA: Diagnosis not present

## 2022-12-11 DIAGNOSIS — R1310 Dysphagia, unspecified: Secondary | ICD-10-CM | POA: Diagnosis not present

## 2023-01-09 IMAGING — DX DG CHEST 1V PORT
2 series · 2 of 2 positions shown · non-contrast
Comparison: 10/07/2021

CLINICAL DATA: Shortness of breath

EXAM:
PORTABLE CHEST 1 VIEW

[chest ap (1 of 2)]
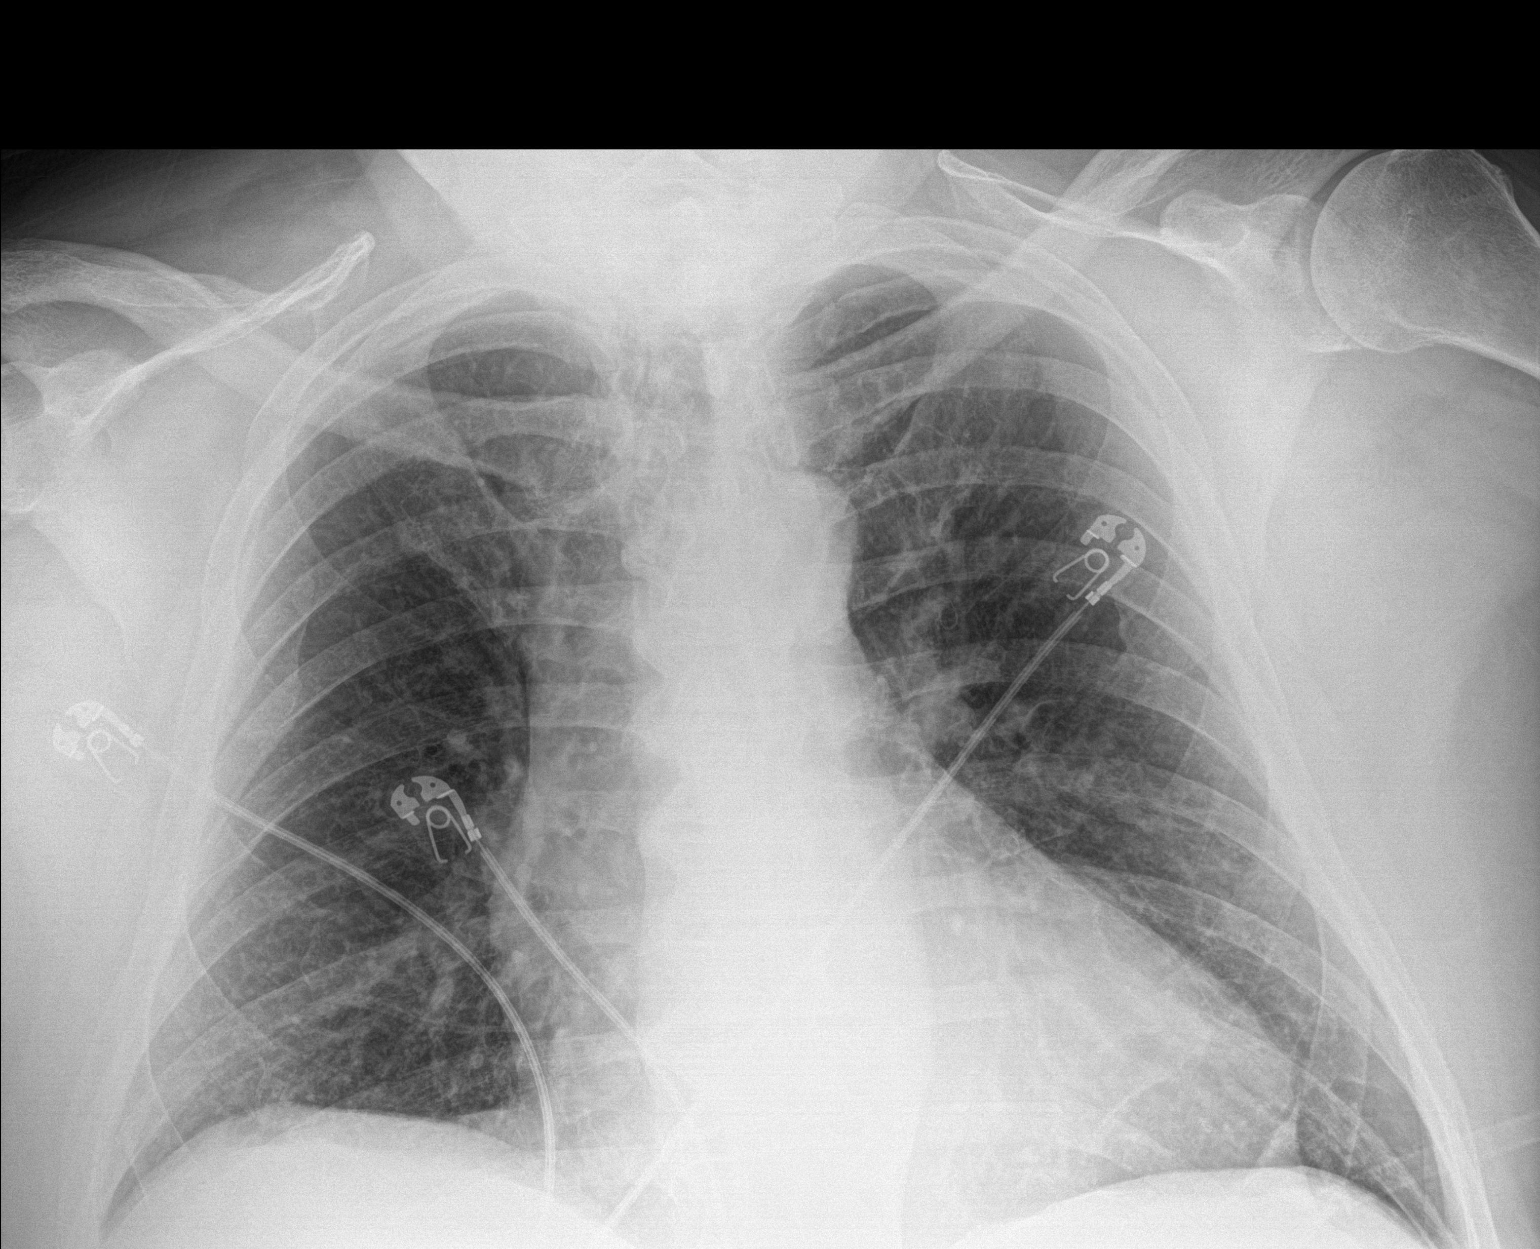

[chest ap (2 of 2)]
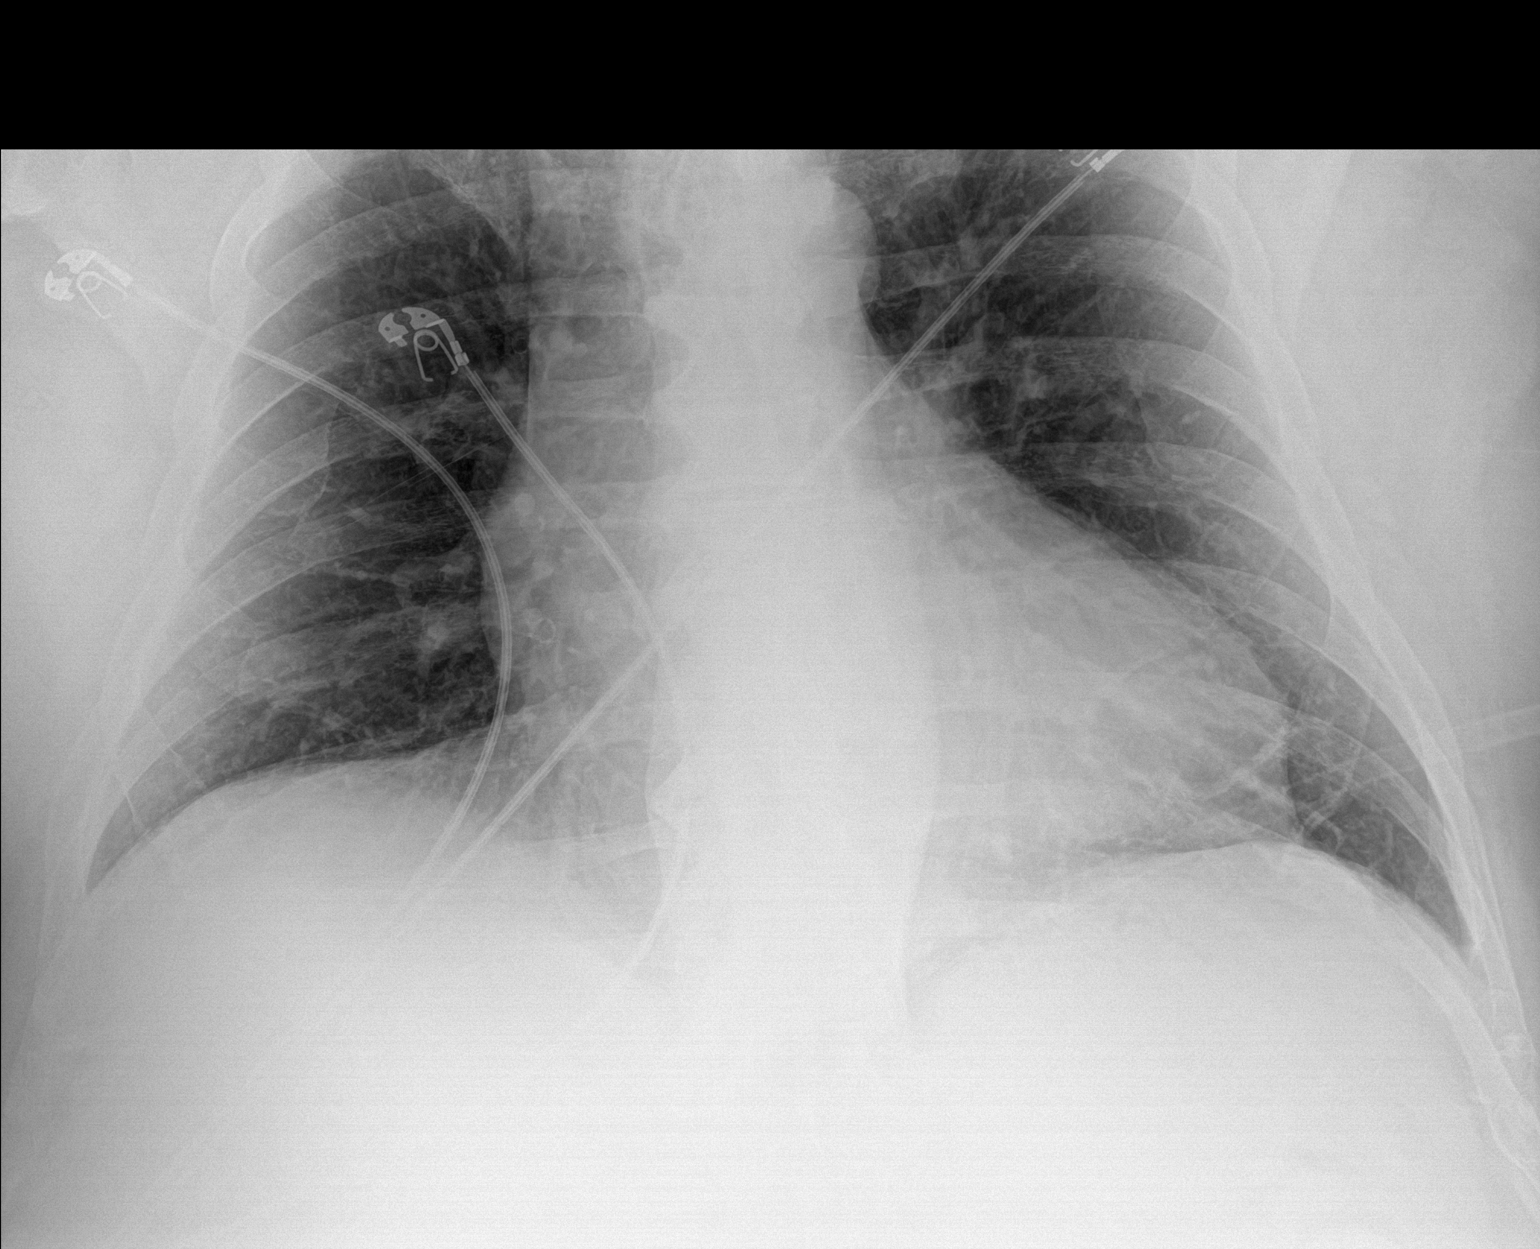

[2 of 2 positions shown; findings below may reference images not displayed]

FINDINGS: The heart size and mediastinal contours are within normal limits.
Both lungs are clear. The visualized skeletal structures are
unremarkable.
IMPRESSION: No active disease.

## 2024-07-23 ENCOUNTER — Emergency Department (HOSPITAL_COMMUNITY)

## 2024-07-23 ENCOUNTER — Encounter (HOSPITAL_COMMUNITY): Payer: Self-pay

## 2024-07-23 ENCOUNTER — Other Ambulatory Visit: Payer: Self-pay

## 2024-07-23 ENCOUNTER — Emergency Department (HOSPITAL_COMMUNITY)
Admission: EM | Admit: 2024-07-23 | Discharge: 2024-07-24 | Disposition: A | Discharge status: Home / Self Care | Attending: Emergency Medicine | Admitting: Emergency Medicine

## 2024-07-23 DIAGNOSIS — S0181XA Laceration without foreign body of other part of head, initial encounter: Secondary | ICD-10-CM

## 2024-07-23 DIAGNOSIS — Y92007 Garden or yard of unspecified non-institutional (private) residence as the place of occurrence of the external cause: Secondary | ICD-10-CM | POA: Diagnosis not present

## 2024-07-23 DIAGNOSIS — K029 Dental caries, unspecified: Secondary | ICD-10-CM | POA: Insufficient documentation

## 2024-07-23 DIAGNOSIS — Z7984 Long term (current) use of oral hypoglycemic drugs: Secondary | ICD-10-CM | POA: Diagnosis not present

## 2024-07-23 DIAGNOSIS — I10 Essential (primary) hypertension: Secondary | ICD-10-CM | POA: Insufficient documentation

## 2024-07-23 DIAGNOSIS — J449 Chronic obstructive pulmonary disease, unspecified: Secondary | ICD-10-CM | POA: Diagnosis not present

## 2024-07-23 DIAGNOSIS — W0110XA Fall on same level from slipping, tripping and stumbling with subsequent striking against unspecified object, initial encounter: Secondary | ICD-10-CM | POA: Insufficient documentation

## 2024-07-23 DIAGNOSIS — Z7982 Long term (current) use of aspirin: Secondary | ICD-10-CM | POA: Diagnosis not present

## 2024-07-23 DIAGNOSIS — Z23 Encounter for immunization: Secondary | ICD-10-CM | POA: Diagnosis not present

## 2024-07-23 DIAGNOSIS — W19XXXA Unspecified fall, initial encounter: Secondary | ICD-10-CM

## 2024-07-23 DIAGNOSIS — Z7951 Long term (current) use of inhaled steroids: Secondary | ICD-10-CM | POA: Insufficient documentation

## 2024-07-23 DIAGNOSIS — Z79899 Other long term (current) drug therapy: Secondary | ICD-10-CM | POA: Insufficient documentation

## 2024-07-23 DIAGNOSIS — E119 Type 2 diabetes mellitus without complications: Secondary | ICD-10-CM | POA: Insufficient documentation

## 2024-07-23 DIAGNOSIS — S0121XA Laceration without foreign body of nose, initial encounter: Secondary | ICD-10-CM | POA: Insufficient documentation

## 2024-07-23 MED ORDER — TETANUS-DIPHTH-ACELL PERTUSSIS 5-2-15.5 LF-MCG/0.5 IM SUSP
0.5000 mL | Freq: Once | INTRAMUSCULAR | Status: AC
  Administered 2024-07-23: 0.5 mL via INTRAMUSCULAR
  Filled 2024-07-23: qty 0.5

## 2024-07-23 NOTE — ED Triage Notes (Signed)
 Pt had a mechanical fall in yard, landed on face. Small laceration to bridge of nose. VSS. Axox4. Denies blood thinners, no LOC.

## 2024-07-23 NOTE — ED Notes (Signed)
 Patient evaluated by PA at triage EMS.

## 2024-07-23 NOTE — ED Provider Triage Note (Signed)
 Emergency Medicine Provider Triage Evaluation Note  Davione Lenker , a 78 y.o. male  was evaluated in triage.  Pt complains of mechanical, nonsyncopal fall.  Was doing some yard work, he stumbled forward and landed on his face.  He denies any other pain.  Small laceration to bridge of the nose.  He reports no difficulty breathing through the nose.  He denies feeling lightheaded or off balance prior to the fall.  He is not sure whether he lost consciousness.  He does not know when his last tetanus shot was..  Review of Systems  Positive: Fall, head injury, nose injury Negative:   Physical Exam  BP (!) 131/56   Pulse 71   Temp 98.4 F (36.9 C)   Resp 17   Ht 5' 11 (1.803 m)   Wt 117.9 kg   SpO2 95%   BMI 36.26 kg/m  Gen:   Awake, no distress   Resp:  Normal effort  MSK:   Moves extremities without difficulty  Other:  Small laceration to bridge of nose, mild ttp of cervical paraspinous muscles, no significant midline spinal ttp.   Medical Decision Making  Medically screening exam initiated at 7:15 PM.  Appropriate orders placed.  Maxxwell Edgett was informed that the remainder of the evaluation will be completed by another provider, this initial triage assessment does not replace that evaluation, and the importance of remaining in the ED until their evaluation is complete.  Workup initiated in triage    Rosan Sherlean DEL, [place] 07/23/24 1918

## 2024-07-24 MED ORDER — ACETAMINOPHEN 500 MG PO TABS
1000.0000 mg | ORAL_TABLET | Freq: Once | ORAL | Status: AC
  Administered 2024-07-24: 1000 mg via ORAL
  Filled 2024-07-24: qty 2

## 2024-07-24 MED ORDER — LIDOCAINE 5 % EX PTCH: 1.0000 | MEDICATED_PATCH | CUTANEOUS | 0 refills | Status: AC

## 2024-07-24 NOTE — Discharge Instructions (Addendum)
 Please follow-up with primary care doctor for reevaluation of symptoms.  Return to emergency room for new or worsening symptoms.  I recommend Tylenol  every 8 hours for pain control.  You can also use lidocaine patch.  Use ice or heat over area of pain.  CT scan recommends following up with dentist for recheck as well.  Please call dentist to schedule an appointment.

## 2024-07-24 NOTE — ED Provider Notes (Signed)
 [place] EMERGENCY DEPARTMENT AT [facility] Provider Note   CSN: [id number] Arrival date & time: 07/23/24  1856     Patient presents with: Vincent Richmond is a 78 y.o. male patient with past medical history of diabetes, hypertension, hyperlipidemia, COPD, obstructive sleep apnea is presenting to emergency room after fall.  Patient notes that he was out working in the yard just prior to arrival when he lost his balance and fell forward landing on his face.  On my exam he has a very small laceration to the bridge of his nose which is nonbleeding.  He reports he has a mild posterior headache but has no other complaints.  He is moving extremities without difficulty.  He ambulates with cane at baseline. NO CP, SOB, abdominal pain. No presyncope, syncope or seizure activity. Not on blood thinner.     Fall       Prior to Admission medications   Medication Sig Start Date End Date Taking? Authorizing Provider  albuterol  (VENTOLIN  HFA) 108 (90 Base) MCG/ACT inhaler Inhale 2 puffs into the lungs every 6 (six) hours as needed for wheezing or shortness of breath. Use this 3-4 times a day for next 3days then use as needed 10/13/21   Fairy Frames, MD  ALPHA LIPOIC ACID PO Take 1 tablet by mouth daily.    [provider]  Ascorbic Acid (VITAMIN C PO) Take 500 mg by mouth 2 (two) times daily.    [provider]  aspirin  EC 325 MG tablet Take 325 mg by mouth daily.    [provider]  buPROPion  (WELLBUTRIN  XL) 300 MG 24 hr tablet Take 300 mg by mouth daily.    [provider]  cefdinir  (OMNICEF ) 300 MG capsule Take 1 capsule (300 mg total) by mouth 2 (two) times daily. 01/25/22   Elgergawy, Brayton RAMAN, MD  cholecalciferol  (VITAMIN D ) 1000 UNITS tablet Take 1,000 Units by mouth daily.    [provider]  FLUoxetine  (PROZAC ) 20 MG capsule Take 80 mg by mouth daily. For depression and anxiety    [provider]  fluticasone  (FLONASE )  50 MCG/ACT nasal spray Place 2 sprays into both nostrils daily. Patient not taking: Reported on 01/25/2022 11/20/21   Byrum, Robert S, MD  fluticasone -salmeterol (WIXELA INHUB) 100-50 MCG/ACT AEPB Inhale 1 puff into the lungs 2 (two) times daily. 10/13/21   Fairy Frames, MD  furosemide  (LASIX ) 20 MG tablet Take 1 tablet (20 mg total) by mouth daily for 7 days. 01/25/22 02/01/22  Elgergawy, Brayton RAMAN, MD  glipiZIDE (GLUCOTROL) 10 MG tablet Take 10 mg by mouth 2 (two) times daily before a meal.    [provider]  loperamide  (IMODIUM ) 2 MG capsule Take 2 mg by mouth as needed.    [provider]  loratadine  (CLARITIN ) 10 MG tablet Take 1 tablet (10 mg total) by mouth daily. Patient not taking: Reported on 01/25/2022 11/20/21   Byrum, Robert S, MD  metFORMIN (GLUCOPHAGE) 1000 MG tablet Take 1,000 mg by mouth 2 (two) times daily with a meal.    [provider]  modafinil  (PROVIGIL ) 100 MG tablet Take 100 mg by mouth every morning.    [provider]  NON FORMULARY CPAP Machine daily    [provider]  pantoprazole  (PROTONIX ) 40 MG tablet Take 1 tablet (40 mg total) by mouth daily. 01/25/22 03/26/22  Elgergawy, Brayton RAMAN, MD  pramipexole  (MIRAPEX ) 0.5 MG tablet Take 1 tablet (0.5 mg total) by mouth  at bedtime. 09/18/21   Christobal Guadalajara, MD  prazosin  (MINIPRESS ) 5 MG capsule Take 15 mg by mouth at bedtime. For nightmares and flashbacks    [provider]  pregabalin  (LYRICA ) 300 MG capsule Take 300 mg by mouth 2 (two) times daily.    [provider]  Probiotic Product (PROBIOTIC DAILY PO) Take 1 capsule by mouth daily.    [provider]  psyllium (METAMUCIL) 58.6 % powder Take 1 packet by mouth 2 (two) times daily.    [provider]  rosuvastatin  (CRESTOR ) 20 MG tablet Take 10 mg by mouth 3 (three) times a week. Monday, Wednesday, Friday 12/04/20   [provider]  Semaglutide (OZEMPIC, 0.25 OR 0.5 MG/DOSE, [place]) Inject 1 mg into  the skin once a week.    [provider]    Allergies: Pregabalin  and Statins    Review of Systems  Musculoskeletal:  Positive for arthralgias.    Updated Vital Signs BP 139/69   Pulse 61   Temp 97.6 F (36.4 C) (Oral)   Resp 13   Ht 5' 11 (1.803 m)   Wt 117.9 kg   SpO2 98%   BMI 36.26 kg/m   Physical Exam Vitals and nursing note reviewed.  Constitutional:      General: He is not in acute distress.    Appearance: He is not toxic-appearing.  HENT:     Head: Normocephalic and atraumatic.  Eyes:     General: No scleral icterus.    Conjunctiva/sclera: Conjunctivae normal.  Cardiovascular:     Rate and Rhythm: Normal rate and regular rhythm.     Pulses: Normal pulses.     Heart sounds: Normal heart sounds.  Pulmonary:     Effort: Pulmonary effort is normal. No respiratory distress.     Breath sounds: Normal breath sounds.  Abdominal:     General: Abdomen is flat. Bowel sounds are normal.     Palpations: Abdomen is soft.     Tenderness: There is no abdominal tenderness.  Skin:    General: Skin is warm and dry.     Findings: No lesion.     Comments: 1 cm laceration to nose, superficial  Neurological:     General: No focal deficit present.     Mental Status: He is alert and oriented to person, place, and time. Mental status is at baseline.     (all labs ordered are listed, but only abnormal results are displayed) Labs Reviewed - No data to display  EKG: None  Radiology: CT Head Wo Contrast Result Date: 07/23/2024 EXAM: CT HEAD, FACIAL BONES AND CERVICAL SPINE WITHOUT CONTRAST 07/23/2024 08:39:47 PM TECHNIQUE: CT of the head, facial bones and cervical spine was performed without the administration of intravenous contrast. Multiplanar reformatted images are provided for review. Automated exposure control, iterative reconstruction, and/or weight based adjustment of the mA/kV was utilized to reduce the radiation dose to as low as reasonably achievable.  COMPARISON: Cervical spine AP and lateral x-rays 02/17/2015. No prior brain or facial imaging. CLINICAL HISTORY: Facial trauma, blunt. FINDINGS: CT HEAD BRAIN AND VENTRICLES: No acute intracranial hemorrhage. No mass effect or midline shift. No extra-axial fluid collection. No evidence of acute infarct. Mild cerebral atrophy, small vessel disease, and atrophic ventriculomegaly. The cerebellum and brainstem are unremarkable. SKULL AND SCALP: No acute skull fracture. No depressed skull fractures. No scalp hematoma. CT FACIAL BONES FACIAL BONES: No acute facial fracture. No mandibular dislocation. No suspicious bone lesion. There are scattered dental caries. Follow-up  with a dentist is recommended. ORBITS: No acute traumatic injury. No orbitofacial fracture is seen. The orbital contents are unremarkable. No intraorbital hematoma, collections, or mass. There appears to be mild asymmetric swelling over the left eyelids. SINUSES AND MASTOIDS: Mild membrane disease in the left frontal sinus, within scattered ethmoid air cells, and in the floor of the maxillary sinuses. The sinuses are otherwise clear. The bilateral mastoid air cells and middle ear cavities are clear. Nasal septum is midline. SOFT TISSUES: No facial hematoma is seen. There is a 1 mm punctate opacity either on or just beneath the skin surface in the lateral left preorbital region soft tissues consistent with a small foreign body. Advanced fatty replacement in both parotid glands. No laryngeal or thyroid mass. Mild calcification both carotid bifurcations. CT CERVICAL SPINE BONES AND ALIGNMENT: No acute fracture or traumatic malalignment. No evidence of cervical spine fractures or focal pathologic bone lesion. There is a trace anterolisthesis at C4-C5, likely due to facet hypertrophy. No traumatic or further listhesis is seen. There is bone-on-bone anterior atlantodental joint space loss with osteophytes. DEGENERATIVE CHANGES: Anterior endplate spurs with  attempted bridging at C3-C4 and C4-C5, bulky anterior osteophytes with bridging C5-C6 through C7-T1. Mild disc space loss at C4-C5 and C5-C6, more advanced disc space loss at C6-C7, C7-T1, and T1-T2. Tiny calcified central disc extrusion at C2-C3, without mass effect. Posterior endplate ridging from C3-C4 through C6-C7 mildly effaces the ventral thecal sac but does not cause cord compression. Left greater than right facet hypertrophy is seen with uncinate joint spurring at most levels. Acquired foraminal stenosis is bilaterally mild to moderate at C3-C4, severe on the left at C4-C5, mild on the left C5-C6, and bilaterally mild at C6-C7. SOFT TISSUES: No prevertebral soft tissue swelling. IMPRESSION: 1. No acute intracranial CT findings or depressed skull fractures. 2. No acute facial bone fracture. 3. No acute cervical spine fracture or traumatic malalignment. 4. 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 5. Carotid atherosclerosis . 6. Multifocal dental caries. Follow-up with a dentist is recommended. 7. Apparent mild swelling in the left eyelids and a tiny foreign body either on the surface or just beneath the skin surface in the outer left preorbital tissues . 8. Additional chronic changes. Electronically signed by: Francis Quam MD 07/23/2024 09:06 PM EDT RP Workstation: [id number]   CT Maxillofacial Wo Contrast Result Date: 07/23/2024 EXAM: CT HEAD, FACIAL BONES AND CERVICAL SPINE WITHOUT CONTRAST 07/23/2024 08:39:47 PM TECHNIQUE: CT of the head, facial bones and cervical spine was performed without the administration of intravenous contrast. Multiplanar reformatted images are provided for review. Automated exposure control, iterative reconstruction, and/or weight based adjustment of the mA/kV was utilized to reduce the radiation dose to as low as reasonably achievable. COMPARISON: Cervical spine AP and lateral x-rays 02/17/2015. No prior brain or facial imaging. CLINICAL HISTORY:  Facial trauma, blunt. FINDINGS: CT HEAD BRAIN AND VENTRICLES: No acute intracranial hemorrhage. No mass effect or midline shift. No extra-axial fluid collection. No evidence of acute infarct. Mild cerebral atrophy, small vessel disease, and atrophic ventriculomegaly. The cerebellum and brainstem are unremarkable. SKULL AND SCALP: No acute skull fracture. No depressed skull fractures. No scalp hematoma. CT FACIAL BONES FACIAL BONES: No acute facial fracture. No mandibular dislocation. No suspicious bone lesion. There are scattered dental caries. Follow-up with a dentist is recommended. ORBITS: No acute traumatic injury. No orbitofacial fracture is seen. The orbital contents are unremarkable. No intraorbital hematoma, collections, or mass. There appears to be mild asymmetric swelling  over the left eyelids. SINUSES AND MASTOIDS: Mild membrane disease in the left frontal sinus, within scattered ethmoid air cells, and in the floor of the maxillary sinuses. The sinuses are otherwise clear. The bilateral mastoid air cells and middle ear cavities are clear. Nasal septum is midline. SOFT TISSUES: No facial hematoma is seen. There is a 1 mm punctate opacity either on or just beneath the skin surface in the lateral left preorbital region soft tissues consistent with a small foreign body. Advanced fatty replacement in both parotid glands. No laryngeal or thyroid mass. Mild calcification both carotid bifurcations. CT CERVICAL SPINE BONES AND ALIGNMENT: No acute fracture or traumatic malalignment. No evidence of cervical spine fractures or focal pathologic bone lesion. There is a trace anterolisthesis at C4-C5, likely due to facet hypertrophy. No traumatic or further listhesis is seen. There is bone-on-bone anterior atlantodental joint space loss with osteophytes. DEGENERATIVE CHANGES: Anterior endplate spurs with attempted bridging at C3-C4 and C4-C5, bulky anterior osteophytes with bridging C5-C6 through C7-T1. Mild disc space  loss at C4-C5 and C5-C6, more advanced disc space loss at C6-C7, C7-T1, and T1-T2. Tiny calcified central disc extrusion at C2-C3, without mass effect. Posterior endplate ridging from C3-C4 through C6-C7 mildly effaces the ventral thecal sac but does not cause cord compression. Left greater than right facet hypertrophy is seen with uncinate joint spurring at most levels. Acquired foraminal stenosis is bilaterally mild to moderate at C3-C4, severe on the left at C4-C5, mild on the left C5-C6, and bilaterally mild at C6-C7. SOFT TISSUES: No prevertebral soft tissue swelling. IMPRESSION: 1. No acute intracranial CT findings or depressed skull fractures. 2. No acute facial bone fracture. 3. No acute cervical spine fracture or traumatic malalignment. 4. 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 5. Carotid atherosclerosis . 6. Multifocal dental caries. Follow-up with a dentist is recommended. 7. Apparent mild swelling in the left eyelids and a tiny foreign body either on the surface or just beneath the skin surface in the outer left preorbital tissues . 8. Additional chronic changes. Electronically signed by: Francis Quam MD 07/23/2024 09:06 PM EDT RP Workstation: [id number]   CT Cervical Spine Wo Contrast Result Date: 07/23/2024 EXAM: CT HEAD, FACIAL BONES AND CERVICAL SPINE WITHOUT CONTRAST 07/23/2024 08:39:47 PM TECHNIQUE: CT of the head, facial bones and cervical spine was performed without the administration of intravenous contrast. Multiplanar reformatted images are provided for review. Automated exposure control, iterative reconstruction, and/or weight based adjustment of the mA/kV was utilized to reduce the radiation dose to as low as reasonably achievable. COMPARISON: Cervical spine AP and lateral x-rays 02/17/2015. No prior brain or facial imaging. CLINICAL HISTORY: Facial trauma, blunt. FINDINGS: CT HEAD BRAIN AND VENTRICLES: No acute intracranial hemorrhage. No mass effect or  midline shift. No extra-axial fluid collection. No evidence of acute infarct. Mild cerebral atrophy, small vessel disease, and atrophic ventriculomegaly. The cerebellum and brainstem are unremarkable. SKULL AND SCALP: No acute skull fracture. No depressed skull fractures. No scalp hematoma. CT FACIAL BONES FACIAL BONES: No acute facial fracture. No mandibular dislocation. No suspicious bone lesion. There are scattered dental caries. Follow-up with a dentist is recommended. ORBITS: No acute traumatic injury. No orbitofacial fracture is seen. The orbital contents are unremarkable. No intraorbital hematoma, collections, or mass. There appears to be mild asymmetric swelling over the left eyelids. SINUSES AND MASTOIDS: Mild membrane disease in the left frontal sinus, within scattered ethmoid air cells, and in the floor of the maxillary sinuses. The sinuses are otherwise  clear. The bilateral mastoid air cells and middle ear cavities are clear. Nasal septum is midline. SOFT TISSUES: No facial hematoma is seen. There is a 1 mm punctate opacity either on or just beneath the skin surface in the lateral left preorbital region soft tissues consistent with a small foreign body. Advanced fatty replacement in both parotid glands. No laryngeal or thyroid mass. Mild calcification both carotid bifurcations. CT CERVICAL SPINE BONES AND ALIGNMENT: No acute fracture or traumatic malalignment. No evidence of cervical spine fractures or focal pathologic bone lesion. There is a trace anterolisthesis at C4-C5, likely due to facet hypertrophy. No traumatic or further listhesis is seen. There is bone-on-bone anterior atlantodental joint space loss with osteophytes. DEGENERATIVE CHANGES: Anterior endplate spurs with attempted bridging at C3-C4 and C4-C5, bulky anterior osteophytes with bridging C5-C6 through C7-T1. Mild disc space loss at C4-C5 and C5-C6, more advanced disc space loss at C6-C7, C7-T1, and T1-T2. Tiny calcified central disc  extrusion at C2-C3, without mass effect. Posterior endplate ridging from C3-C4 through C6-C7 mildly effaces the ventral thecal sac but does not cause cord compression. Left greater than right facet hypertrophy is seen with uncinate joint spurring at most levels. Acquired foraminal stenosis is bilaterally mild to moderate at C3-C4, severe on the left at C4-C5, mild on the left C5-C6, and bilaterally mild at C6-C7. SOFT TISSUES: No prevertebral soft tissue swelling. IMPRESSION: 1. No acute intracranial CT findings or depressed skull fractures. 2. No acute facial bone fracture. 3. No acute cervical spine fracture or traumatic malalignment. 4. 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 5. Carotid atherosclerosis . 6. Multifocal dental caries. Follow-up with a dentist is recommended. 7. Apparent mild swelling in the left eyelids and a tiny foreign body either on the surface or just beneath the skin surface in the outer left preorbital tissues . 8. Additional chronic changes. Electronically signed by: Francis Quam MD 07/23/2024 09:06 PM EDT RP Workstation: [id number]     .Laceration Repair  Date/Time: 07/24/2024 7:16 AM  Performed by: Shermon Warren SAILOR, PA-C Authorized by: Shermon Warren SAILOR, PA-C   Consent:    Consent obtained:  Verbal   Consent given by:  Patient   Risks, benefits, and alternatives were discussed: yes     Risks discussed:  Infection, need for additional repair, nerve damage, poor wound healing, poor cosmetic result, retained foreign body, pain, tendon damage and vascular damage   Alternatives discussed:  Referral, observation, delayed treatment and no treatment Universal protocol:    Procedure explained and questions answered to patient or proxy's satisfaction: yes     Relevant documents present and verified: yes     Test results available: yes     Imaging studies available: yes     Required blood products, implants, devices, and special equipment available:  yes     Site/side marked: yes     Immediately prior to procedure, a time out was called: yes     Patient identity confirmed:  Verbally with patient Anesthesia:    Anesthesia method:  None Laceration details:    Location:  Face   Face location:  Nose   Length (cm):  1 Pre-procedure details:    Preparation:  Patient was prepped and draped in usual sterile fashion Treatment:    Area cleansed with:  Povidone-iodine Skin repair:    Repair method:  Tissue adhesive Repair type:    Repair type:  Simple Post-procedure details:    Dressing:  Open (no dressing)   Procedure completion:  Tolerated    Medications Ordered in the ED  acetaminophen  (TYLENOL ) tablet 1,000 mg (has no administration in time range)  Tdap (ADACEL) injection 0.5 mL (0.5 mLs Intramuscular Given 07/23/24 1925)                                    Medical Decision Making  This patient presents to the ED for concern of fall, this involves an extensive number of treatment options, and is a complaint that carries with it a high risk of complications and morbidity.  The differential diagnosis includes syncope, presyncope, intercranial abnormality   Co morbidities that complicate the patient evaluation  COPD, OSA   Imaging Studies ordered:  I ordered imaging studies including CT head, cervical spine, face  I independently visualized and interpreted imaging which showed no acute findings.  Dental caries recommending f/u OP which I discussed with patient.  Mention of foreign body over left eyelid.  Not found on my exam.  No laceration to this area/abrasion. I agree with the radiologist interpretation   Cardiac Monitoring: / EKG:  The patient was maintained on a cardiac monitor.     Problem List / ED Course / Critical interventions / Medication management  Patient presents emergency room with mechanical fall.  He did hit his head.  Presents with no loss of consciousness, confusion or altered mental status.  He  is not on blood thinner.  He denies any neck pain at this time and he has no cervical midline tenderness step-off or deformity.  He is moving extremities without any difficulty and ambulated here with cane.  He has no chest pain or shortness of breath.  His scans are overall reassuring.  He does have a small laceration to the bridge of his nose which was cleaned and covered. I ordered medication including tdap updated here, tylenol  for HA Reevaluation of the patient after these medicines showed that the patient improved I have reviewed the patients home medicines and have made adjustments as needed. Patient hemodynamically stable and well-appearing.  Feel appropriate for discharge with outpatient follow-up        Final diagnoses:  Fall, initial encounter  Facial laceration, initial encounter  Dental caries    ED Discharge Orders     None          Shermon Warren SAILOR, PA-C 07/24/24 [id number]    Patsey Lot, MD 07/27/24 1101

## 2024-07-24 NOTE — ED Notes (Signed)
 Pt's only hearing aid he presented with in pink denture cup in pt belonging bag handed to PT[facility] at time of discharge.
